# Patient Record
Sex: Female | Born: 1950
Health system: Southern US, Community
[De-identification: ages and names within clinical notes are randomized; demographics above are authoritative.]

## PROBLEM LIST (undated history)

## (undated) DIAGNOSIS — I2510 Atherosclerotic heart disease of native coronary artery without angina pectoris: Secondary | ICD-10-CM

## (undated) DIAGNOSIS — G4700 Insomnia, unspecified: Secondary | ICD-10-CM

## (undated) DIAGNOSIS — R3129 Other microscopic hematuria: Secondary | ICD-10-CM

## (undated) DIAGNOSIS — K219 Gastro-esophageal reflux disease without esophagitis: Secondary | ICD-10-CM

## (undated) DIAGNOSIS — K429 Umbilical hernia without obstruction or gangrene: Secondary | ICD-10-CM

## (undated) DIAGNOSIS — T8859XA Other complications of anesthesia, initial encounter: Secondary | ICD-10-CM

## (undated) DIAGNOSIS — J329 Chronic sinusitis, unspecified: Secondary | ICD-10-CM

## (undated) DIAGNOSIS — E871 Hypo-osmolality and hyponatremia: Secondary | ICD-10-CM

## (undated) DIAGNOSIS — G500 Trigeminal neuralgia: Secondary | ICD-10-CM

## (undated) DIAGNOSIS — R590 Localized enlarged lymph nodes: Secondary | ICD-10-CM

## (undated) DIAGNOSIS — M542 Cervicalgia: Secondary | ICD-10-CM

## (undated) DIAGNOSIS — N870 Mild cervical dysplasia: Secondary | ICD-10-CM

## (undated) DIAGNOSIS — M1990 Unspecified osteoarthritis, unspecified site: Secondary | ICD-10-CM

## (undated) DIAGNOSIS — R21 Rash and other nonspecific skin eruption: Secondary | ICD-10-CM

## (undated) DIAGNOSIS — M5126 Other intervertebral disc displacement, lumbar region: Secondary | ICD-10-CM

## (undated) DIAGNOSIS — I10 Essential (primary) hypertension: Secondary | ICD-10-CM

## (undated) DIAGNOSIS — E785 Hyperlipidemia, unspecified: Secondary | ICD-10-CM

## (undated) DIAGNOSIS — Z79899 Other long term (current) drug therapy: Secondary | ICD-10-CM

## (undated) DIAGNOSIS — F419 Anxiety disorder, unspecified: Secondary | ICD-10-CM

## (undated) DIAGNOSIS — D72819 Decreased white blood cell count, unspecified: Secondary | ICD-10-CM

## (undated) DIAGNOSIS — E7800 Pure hypercholesterolemia, unspecified: Secondary | ICD-10-CM

## (undated) DIAGNOSIS — Z8719 Personal history of other diseases of the digestive system: Secondary | ICD-10-CM

## (undated) DIAGNOSIS — M549 Dorsalgia, unspecified: Secondary | ICD-10-CM

## (undated) DIAGNOSIS — R519 Headache, unspecified: Secondary | ICD-10-CM

## (undated) DIAGNOSIS — L299 Pruritus, unspecified: Secondary | ICD-10-CM

## (undated) DIAGNOSIS — D649 Anemia, unspecified: Secondary | ICD-10-CM

## (undated) HISTORY — DX: Other long term (current) drug therapy: Z79.899

## (undated) HISTORY — DX: Umbilical hernia without obstruction or gangrene: K42.9

## (undated) HISTORY — PX: COLPOSCOPY: SHX161

## (undated) HISTORY — DX: Chronic sinusitis, unspecified: J32.9

## (undated) HISTORY — DX: Anxiety disorder, unspecified: F41.9

## (undated) HISTORY — DX: Pruritus, unspecified: L29.9

## (undated) HISTORY — DX: Hypo-osmolality and hyponatremia: E87.1

## (undated) HISTORY — DX: Hyperlipidemia, unspecified: E78.5

## (undated) HISTORY — DX: Dorsalgia, unspecified: M54.9

## (undated) HISTORY — DX: Trigeminal neuralgia: G50.0

## (undated) HISTORY — DX: Cervicalgia: M54.2

## (undated) HISTORY — PX: UMBILICAL HERNIA REPAIR: SHX196

## (undated) HISTORY — DX: Insomnia, unspecified: G47.00

## (undated) HISTORY — DX: Decreased white blood cell count, unspecified: D72.819

## (undated) HISTORY — DX: Other microscopic hematuria: R31.29

## (undated) HISTORY — PX: DILATION AND CURETTAGE OF UTERUS: SHX78

## (undated) HISTORY — PX: CERVICAL BIOPSY  W/ LOOP ELECTRODE EXCISION: SUR135

## (undated) HISTORY — DX: Gastro-esophageal reflux disease without esophagitis: K21.9

## (undated) HISTORY — PX: TYMPANOSTOMY TUBE PLACEMENT: SHX32

## (undated) HISTORY — DX: Essential (primary) hypertension: I10

## (undated) HISTORY — DX: Rash and other nonspecific skin eruption: R21

## (undated) HISTORY — DX: Headache, unspecified: R51.9

## (undated) HISTORY — DX: Pure hypercholesterolemia, unspecified: E78.00

## (undated) HISTORY — DX: Atherosclerotic heart disease of native coronary artery without angina pectoris: I25.10

## (undated) HISTORY — DX: Anemia, unspecified: D64.9

## (undated) HISTORY — DX: Other intervertebral disc displacement, lumbar region: M51.26

## (undated) HISTORY — DX: Mild cervical dysplasia: N87.0

## (undated) HISTORY — DX: Unspecified osteoarthritis, unspecified site: M19.90

## (undated) HISTORY — DX: Localized enlarged lymph nodes: R59.0

---

## 1997-12-15 ENCOUNTER — Other Ambulatory Visit: Admission: RE | Admit: 1997-12-15 | Discharge: 1997-12-15 | Payer: Self-pay | Admitting: Obstetrics and Gynecology

## 1998-06-23 ENCOUNTER — Other Ambulatory Visit: Admission: RE | Admit: 1998-06-23 | Discharge: 1998-06-23 | Payer: Self-pay | Admitting: Obstetrics and Gynecology

## 1999-01-03 ENCOUNTER — Ambulatory Visit (HOSPITAL_COMMUNITY): Admission: RE | Admit: 1999-01-03 | Discharge: 1999-01-03 | Payer: Self-pay | Admitting: Gastroenterology

## 1999-07-19 ENCOUNTER — Other Ambulatory Visit: Admission: RE | Admit: 1999-07-19 | Discharge: 1999-07-19 | Payer: Self-pay | Admitting: Obstetrics and Gynecology

## 2000-07-26 ENCOUNTER — Other Ambulatory Visit: Admission: RE | Admit: 2000-07-26 | Discharge: 2000-07-26 | Payer: Self-pay | Admitting: Obstetrics and Gynecology

## 2001-03-07 ENCOUNTER — Encounter (INDEPENDENT_AMBULATORY_CARE_PROVIDER_SITE_OTHER): Payer: Self-pay

## 2001-03-07 ENCOUNTER — Ambulatory Visit (HOSPITAL_COMMUNITY): Admission: RE | Admit: 2001-03-07 | Discharge: 2001-03-07 | Payer: Self-pay | Admitting: Obstetrics and Gynecology

## 2001-08-15 ENCOUNTER — Other Ambulatory Visit: Admission: RE | Admit: 2001-08-15 | Discharge: 2001-08-15 | Payer: Self-pay | Admitting: Obstetrics and Gynecology

## 2002-12-04 ENCOUNTER — Other Ambulatory Visit: Admission: RE | Admit: 2002-12-04 | Discharge: 2002-12-04 | Payer: Self-pay | Admitting: Obstetrics and Gynecology

## 2002-12-23 ENCOUNTER — Emergency Department (HOSPITAL_COMMUNITY): Admission: EM | Admit: 2002-12-23 | Discharge: 2002-12-23 | Payer: Self-pay | Admitting: Emergency Medicine

## 2003-04-14 ENCOUNTER — Other Ambulatory Visit: Admission: RE | Admit: 2003-04-14 | Discharge: 2003-04-14 | Payer: Self-pay | Admitting: Obstetrics and Gynecology

## 2003-09-28 ENCOUNTER — Other Ambulatory Visit: Admission: RE | Admit: 2003-09-28 | Discharge: 2003-09-28 | Payer: Self-pay | Admitting: Obstetrics and Gynecology

## 2003-12-21 ENCOUNTER — Encounter: Admission: RE | Admit: 2003-12-21 | Discharge: 2003-12-21 | Payer: Self-pay | Admitting: Obstetrics and Gynecology

## 2004-02-11 ENCOUNTER — Other Ambulatory Visit: Admission: RE | Admit: 2004-02-11 | Discharge: 2004-02-11 | Payer: Self-pay | Admitting: Obstetrics and Gynecology

## 2004-08-01 ENCOUNTER — Other Ambulatory Visit: Admission: RE | Admit: 2004-08-01 | Discharge: 2004-08-01 | Payer: Self-pay | Admitting: Addiction Medicine

## 2005-03-06 ENCOUNTER — Encounter: Admission: RE | Admit: 2005-03-06 | Discharge: 2005-03-06 | Payer: Self-pay | Admitting: Neurology

## 2005-03-29 ENCOUNTER — Encounter: Admission: RE | Admit: 2005-03-29 | Discharge: 2005-03-29 | Payer: Self-pay | Admitting: Obstetrics and Gynecology

## 2005-04-17 ENCOUNTER — Encounter: Admission: RE | Admit: 2005-04-17 | Discharge: 2005-04-17 | Payer: Self-pay | Admitting: Obstetrics and Gynecology

## 2005-04-26 ENCOUNTER — Other Ambulatory Visit: Admission: RE | Admit: 2005-04-26 | Discharge: 2005-04-26 | Payer: Self-pay | Admitting: Obstetrics and Gynecology

## 2005-12-06 ENCOUNTER — Other Ambulatory Visit: Admission: RE | Admit: 2005-12-06 | Discharge: 2005-12-06 | Payer: Self-pay | Admitting: Obstetrics and Gynecology

## 2006-04-13 ENCOUNTER — Encounter: Admission: RE | Admit: 2006-04-13 | Discharge: 2006-04-13 | Payer: Self-pay | Admitting: Obstetrics and Gynecology

## 2006-05-07 ENCOUNTER — Other Ambulatory Visit: Admission: RE | Admit: 2006-05-07 | Discharge: 2006-05-07 | Payer: Self-pay | Admitting: Obstetrics and Gynecology

## 2007-04-16 ENCOUNTER — Encounter: Admission: RE | Admit: 2007-04-16 | Discharge: 2007-04-16 | Payer: Self-pay | Admitting: Obstetrics and Gynecology

## 2007-05-09 ENCOUNTER — Other Ambulatory Visit: Admission: RE | Admit: 2007-05-09 | Discharge: 2007-05-09 | Payer: Self-pay | Admitting: Obstetrics and Gynecology

## 2008-05-20 ENCOUNTER — Encounter: Admission: RE | Admit: 2008-05-20 | Discharge: 2008-05-20 | Payer: Self-pay | Admitting: Obstetrics and Gynecology

## 2008-05-28 ENCOUNTER — Encounter: Payer: Self-pay | Admitting: Obstetrics and Gynecology

## 2008-05-28 ENCOUNTER — Other Ambulatory Visit: Admission: RE | Admit: 2008-05-28 | Discharge: 2008-05-28 | Payer: Self-pay | Admitting: Obstetrics and Gynecology

## 2008-05-28 ENCOUNTER — Ambulatory Visit: Payer: Self-pay | Admitting: Obstetrics and Gynecology

## 2008-06-22 ENCOUNTER — Encounter: Admission: RE | Admit: 2008-06-22 | Discharge: 2008-06-22 | Payer: Self-pay | Admitting: Internal Medicine

## 2009-03-16 ENCOUNTER — Encounter: Admission: RE | Admit: 2009-03-16 | Discharge: 2009-03-16 | Payer: Self-pay | Admitting: Otolaryngology

## 2009-05-24 ENCOUNTER — Encounter: Admission: RE | Admit: 2009-05-24 | Discharge: 2009-05-24 | Payer: Self-pay | Admitting: Obstetrics and Gynecology

## 2009-06-03 ENCOUNTER — Other Ambulatory Visit: Admission: RE | Admit: 2009-06-03 | Discharge: 2009-06-03 | Payer: Self-pay | Admitting: Obstetrics and Gynecology

## 2009-06-03 ENCOUNTER — Ambulatory Visit: Payer: Self-pay | Admitting: Obstetrics and Gynecology

## 2010-04-23 ENCOUNTER — Encounter: Payer: Self-pay | Admitting: Obstetrics and Gynecology

## 2010-05-11 ENCOUNTER — Other Ambulatory Visit: Payer: Self-pay | Admitting: Obstetrics and Gynecology

## 2010-05-11 DIAGNOSIS — Z1231 Encounter for screening mammogram for malignant neoplasm of breast: Secondary | ICD-10-CM

## 2010-05-26 ENCOUNTER — Ambulatory Visit
Admission: RE | Admit: 2010-05-26 | Discharge: 2010-05-26 | Disposition: A | Discharge status: Home / Self Care | Payer: 59 | Source: Ambulatory Visit | Attending: Obstetrics and Gynecology | Admitting: Obstetrics and Gynecology

## 2010-05-26 DIAGNOSIS — Z1231 Encounter for screening mammogram for malignant neoplasm of breast: Secondary | ICD-10-CM

## 2010-06-07 ENCOUNTER — Other Ambulatory Visit (HOSPITAL_COMMUNITY)
Admission: RE | Admit: 2010-06-07 | Discharge: 2010-06-07 | Disposition: A | Discharge status: Home / Self Care | Payer: 59 | Source: Ambulatory Visit | Attending: Obstetrics and Gynecology | Admitting: Obstetrics and Gynecology

## 2010-06-07 ENCOUNTER — Encounter (INDEPENDENT_AMBULATORY_CARE_PROVIDER_SITE_OTHER): Payer: 59 | Admitting: Obstetrics and Gynecology

## 2010-06-07 ENCOUNTER — Other Ambulatory Visit: Payer: Self-pay | Admitting: Obstetrics and Gynecology

## 2010-06-07 DIAGNOSIS — Z01419 Encounter for gynecological examination (general) (routine) without abnormal findings: Secondary | ICD-10-CM

## 2010-06-07 DIAGNOSIS — Z124 Encounter for screening for malignant neoplasm of cervix: Secondary | ICD-10-CM | POA: Insufficient documentation

## 2010-08-19 NOTE — Op Note (Signed)
[facility]  Patient:    Kimberly Baxter, PUN Visit Number: [id number] MRN: [id number]          Service Type: DSU Location: [facility] Attending Physician:  Sharon Mt Dictated by:   Rande Brunt. Eda Paschal, M.D. Proc. Date: 03/07/01 Admit Date:  03/07/2001                             Operative Report  PREOPERATIVE DIAGNOSIS:         Endometrial polyp.  POSTOPERATIVE DIAGNOSIS:        Endometrial polyp versus small mucous myoma.  OPERATION:                      Hysteroscopy with excision of endometrial lesion.  SURGEON:                        Daniel L. Eda Paschal, M.D.  ANESTHESIA:                     General.  INDICATIONS:                    The patient is a 60 year old female who had presented to the office with some symptoms of uterine prolapse.  Because of concern that there was swelling behind the uterus causing the uterus to have dropped an ultrasound was done, and at the time of the ultrasound an endometrial polyp was seen.  It was confirmed on sonohysterogram.  She now enters the hospital for excision of the above.  FINDINGS:                       External and vagina is within normal limits. The cervix is clean.  The uterus is severely retroverted, normal size and shape with 1.5 degrees of uterine descensus.  Adnexa are not palpably enlarged.  At the time of hysteroscopy, the patient had a lesion on the anterior wall of the uterus of approximately 1 to 1.5 cm that had a consistency somewhat between an endometrial polyp and a small myoma.  Other than this, the rest of the findings on hysteroscopy including top of the fundus, tubal, coronal areas, anterior and posterior walls of the fundus, lower uterine segment, endocervical canal were free of any disease.  DESCRIPTION OF PROCEDURE:       After adequate general anesthesia, the patient was placed in the dorsal lithotomy position, prepped and draped in the usual sterile manner.  A  single-tooth tenaculum was placed in the anterior lip of the cervix, and the cervix dilated to a #35 Pratt dilator.  A hysteroscopic examination was done with the hysteroscopic resectoscope and 3% sorbitol was used to expand the intrauterine cavity, and a camera was used for magnification.  The above findings were noted.  Using a single wire loop with settings of 70 coag, 110 cutting, blend I, the lesion was excised in several pieces.  The base was cauterized to prevent any bleeding.  At the termination of the procedure there was no bleeding noted.  The tissue was sent to pathology for tissue diagnosis.  There was a total fluid deficit of 400 cc, however, most of this was because there was a leak through the bag onto the floor and it was felt that the deficit was probably much less than 100.  The patient tolerated the procedure well and left the  operating room in satisfactory condition. Dictated by:   Rande Brunt. Eda Paschal, M.D. Attending Physician:  Sharon Mt DD:  03/07/01 TD:  03/07/01 Job: [id number]

## 2011-01-24 ENCOUNTER — Other Ambulatory Visit: Payer: Self-pay | Admitting: Dermatology

## 2011-04-04 DIAGNOSIS — M5126 Other intervertebral disc displacement, lumbar region: Secondary | ICD-10-CM

## 2011-04-04 HISTORY — DX: Other intervertebral disc displacement, lumbar region: M51.26

## 2011-05-02 ENCOUNTER — Other Ambulatory Visit: Payer: Self-pay | Admitting: Obstetrics and Gynecology

## 2011-05-02 DIAGNOSIS — Z1231 Encounter for screening mammogram for malignant neoplasm of breast: Secondary | ICD-10-CM

## 2011-05-29 ENCOUNTER — Ambulatory Visit
Admission: RE | Admit: 2011-05-29 | Discharge: 2011-05-29 | Disposition: A | Discharge status: Home / Self Care | Payer: 59 | Source: Ambulatory Visit | Attending: Obstetrics and Gynecology | Admitting: Obstetrics and Gynecology

## 2011-05-29 DIAGNOSIS — Z1231 Encounter for screening mammogram for malignant neoplasm of breast: Secondary | ICD-10-CM

## 2011-06-07 ENCOUNTER — Encounter: Payer: Self-pay | Admitting: Gynecology

## 2011-06-07 DIAGNOSIS — E7800 Pure hypercholesterolemia, unspecified: Secondary | ICD-10-CM | POA: Insufficient documentation

## 2011-06-07 DIAGNOSIS — K429 Umbilical hernia without obstruction or gangrene: Secondary | ICD-10-CM | POA: Insufficient documentation

## 2011-06-07 DIAGNOSIS — G500 Trigeminal neuralgia: Secondary | ICD-10-CM | POA: Insufficient documentation

## 2011-06-07 DIAGNOSIS — N870 Mild cervical dysplasia: Secondary | ICD-10-CM | POA: Insufficient documentation

## 2011-06-15 ENCOUNTER — Other Ambulatory Visit (HOSPITAL_COMMUNITY)
Admission: RE | Admit: 2011-06-15 | Discharge: 2011-06-15 | Disposition: A | Discharge status: Home / Self Care | Payer: 59 | Source: Ambulatory Visit | Attending: Obstetrics and Gynecology | Admitting: Obstetrics and Gynecology

## 2011-06-15 ENCOUNTER — Encounter: Payer: Self-pay | Admitting: Obstetrics and Gynecology

## 2011-06-15 ENCOUNTER — Ambulatory Visit (INDEPENDENT_AMBULATORY_CARE_PROVIDER_SITE_OTHER): Payer: 59 | Admitting: Obstetrics and Gynecology

## 2011-06-15 VITALS — BP 122/74 | Ht 66.0 in | Wt 142.0 lb

## 2011-06-15 DIAGNOSIS — Z01419 Encounter for gynecological examination (general) (routine) without abnormal findings: Secondary | ICD-10-CM | POA: Insufficient documentation

## 2011-06-15 DIAGNOSIS — M1990 Unspecified osteoarthritis, unspecified site: Secondary | ICD-10-CM | POA: Insufficient documentation

## 2011-06-15 NOTE — Progress Notes (Signed)
The patient came to see me today for her annual GYN exam. She is doing well without GYN problems. She does not require HRT. She does use vaginal estrogen with excellent results. She is having no vaginal bleeding. She is having no pelvic pain. She does her lab through PCP. She is up-to-date on mammograms. She had normal bone density.  Physical examination: Kimberly Baxter present. HEENT within normal limits. Neck: Thyroid not large. No masses. Supraclavicular nodes: not enlarged. Breasts: Examined in both sitting midline position. No skin changes and no masses. Abdomen: Soft no guarding rebound or masses or hernia. Pelvic: External: Within normal limits. BUS: Within normal limits. Vaginal:within normal limits. Good estrogen effect. No evidence of cystocele rectocele or enterocele. Cervix: clean. Uterus: Normal size and shape. Adnexa: No masses. Rectovaginal exam: Confirmatory and negative. Extremities: Within normal limits.  Assessment: Atrophic vaginitis  Plan: Continue yearly mammograms. Continue estradiol vaginal cream 0.02% twice a week.

## 2011-06-16 LAB — URINALYSIS W MICROSCOPIC + REFLEX CULTURE
Bilirubin Urine: NEGATIVE
Crystals: NONE SEEN
Hgb urine dipstick: NEGATIVE
Nitrite: NEGATIVE
Protein, ur: NEGATIVE mg/dL
Specific Gravity, Urine: 1.006 (ref 1.005–1.030)
pH: 7 (ref 5.0–8.0)

## 2011-07-11 ENCOUNTER — Other Ambulatory Visit: Payer: Self-pay | Admitting: Dermatology

## 2011-11-22 ENCOUNTER — Ambulatory Visit
Admission: RE | Admit: 2011-11-22 | Discharge: 2011-11-22 | Disposition: A | Discharge status: Home / Self Care | Payer: 59 | Source: Ambulatory Visit | Attending: Internal Medicine | Admitting: Internal Medicine

## 2011-11-22 ENCOUNTER — Other Ambulatory Visit: Payer: Self-pay | Admitting: Internal Medicine

## 2011-11-22 DIAGNOSIS — M79605 Pain in left leg: Secondary | ICD-10-CM

## 2011-11-22 DIAGNOSIS — M545 Low back pain: Secondary | ICD-10-CM

## 2011-11-22 DIAGNOSIS — M79604 Pain in right leg: Secondary | ICD-10-CM

## 2012-05-13 ENCOUNTER — Other Ambulatory Visit: Payer: Self-pay | Admitting: *Deleted

## 2012-05-13 DIAGNOSIS — Z1231 Encounter for screening mammogram for malignant neoplasm of breast: Secondary | ICD-10-CM

## 2012-06-11 ENCOUNTER — Ambulatory Visit
Admission: RE | Admit: 2012-06-11 | Discharge: 2012-06-11 | Disposition: A | Discharge status: Home / Self Care | Payer: 59 | Source: Ambulatory Visit | Attending: *Deleted | Admitting: *Deleted

## 2012-06-11 DIAGNOSIS — Z1231 Encounter for screening mammogram for malignant neoplasm of breast: Secondary | ICD-10-CM

## 2012-08-21 ENCOUNTER — Encounter: Payer: Self-pay | Admitting: Obstetrics and Gynecology

## 2012-08-21 ENCOUNTER — Ambulatory Visit (INDEPENDENT_AMBULATORY_CARE_PROVIDER_SITE_OTHER): Payer: 59 | Admitting: Obstetrics and Gynecology

## 2012-08-21 VITALS — BP 100/60 | Ht 66.0 in | Wt 140.0 lb

## 2012-08-21 DIAGNOSIS — Z01419 Encounter for gynecological examination (general) (routine) without abnormal findings: Secondary | ICD-10-CM

## 2012-08-21 MED ORDER — ESTRADIOL 0.1 MG/GM VA CREA: TOPICAL_CREAM | VAGINAL | Status: DC

## 2012-08-21 NOTE — Patient Instructions (Signed)

## 2012-08-21 NOTE — Progress Notes (Addendum)
62 y.o.   Married    Caucasian   female   G2P2002   here for annual exam.  Took HRT for about 1 1/2 years and quit b/c of strong FH of cancer.  H/o 2 LEEP's back in the 90's, nl paps since.  Uses her e2 vag cream infrequently, but last time she had sex, it was painful.    Patient's last menstrual period was 04/03/2002.          Sexually active: yes  The current method of family planning is post menopausal status.    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 Colon cancer Father   . Pancreatic cancer Father   . Stomach cancer Father   . Diabetes Maternal Grandmother   . Heart disease Maternal Grandmother   . Hypertension Maternal Grandfather   . Heart disease Maternal Grandfather   . Cancer Mother     Carcinoid syndrome    Patient Active Problem List   Diagnosis Date Noted  . Arthritis   . CIN I (cervical intraepithelial neoplasia I)   . Elevated cholesterol   . Trigeminal neuralgia   . Umbilical hernia     Past Medical History  Diagnosis Date  . CIN I (cervical intraepithelial neoplasia I)   . Elevated cholesterol   . Trigeminal neuralgia   . Umbilical hernia   . Arthritis   . Hypertension   . Lumbar herniated disc 2013    Past Surgical History  Procedure Laterality Date  . Colposcopy    . Cervical biopsy  w/ loop electrode excision  [id number]    X 2  . Umbilical hernia repair      Allergies: Review of patient's allergies indicates no known allergies.  Current Outpatient Prescriptions  Medication Sig Dispense Refill  . CarBAMazepine (TEGRETOL  PO) Take 200 mg by mouth 2 (two) times daily.       Marland Kitchen ESTRADIOL VA Place vaginally 2 (two) times a week.       . losartan (COZAAR) 50 MG tablet Take 50 mg by mouth daily.      . Rosuvastatin Calcium (CRESTOR PO) Take 20 mg by mouth at bedtime.       . Calcium Carbonate-Vitamin D (CALCIUM + D PO) Take by mouth.       No current facility-administered medications for this visit.  Tegretol is for trigeminal neuralgia on right.    ROS: Pertinent items are noted in HPI.  Social Hx: married, two grown children, a one year old grandchild   Exam:    BP 100/60  Ht 5\' 6"  (1.676 m)  Wt 140 lb (63.504 kg)  BMI 22.61 kg/m2  LMP 04/03/2002   Wt Readings from Last 3 Encounters:  08/21/12 140 lb (63.504 kg)  06/15/11 142 lb (64.411 kg)     Ht Readings from Last 3 Encounters:  08/21/12 5\' 6"  (1.676 m)  06/15/11 5\' 6"  (1.676 m)    General appearance: alert, cooperative and appears stated age Head: Normocephalic, without obvious abnormality, atraumatic  Neck: no adenopathy, supple, symmetrical, trachea midline and thyroid not enlarged, symmetric, no tenderness/mass/nodules Lungs: clear to auscultation bilaterally Breasts: Inspection negative, No nipple retraction or dimpling, No nipple discharge or bleeding, No axillary or supraclavicular adenopathy, Normal to palpation without dominant masses Heart: regular rate and rhythm Abdomen: soft, non-tender; bowel sounds normal; no masses,  no organomegaly Extremities: extremities normal, atraumatic, no cyanosis or edema Skin: Skin color, texture, turgor normal. No rashes or lesions Lymph nodes: Cervical, supraclavicular, and axillary nodes normal. No abnormal inguinal nodes palpated Neurologic: Grossly normal   Pelvic: External genitalia:  no lesions              Urethra:  normal appearing urethra with no masses, tenderness or lesions              Bartholins and Skenes: normal                 Vagina: normal appearing vagina with normal color and  discharge, no lesions              Cervix: normal appearance              Pap taken: no        Bimanual Exam:  Uterus:  uterus is normal size, shape, consistency and nontender, AF, mobile                                      Adnexa: normal adnexa in size, nontender and no masses                                      Rectovaginal: Confirms                                      Anus:  normal sphincter tone, no lesions  A: normal menopausal exam, no HRT     Trigeminal neuralgia, under tx with tegretol     Wears a hearing aid left ear     H/o LGSIL recurrently from 2004 until 2006.  S/p LEEP x 2, last one in 2006 showing atypia only.  Paps since then nl.       P: mammogram counseled on breast self exam, mammography screening, adequate intake of calcium and vitamin D, diet and exercise return annually or prn     An After Visit Summary was printed and given to the patient.

## 2013-01-14 ENCOUNTER — Encounter: Payer: Self-pay | Admitting: Gynecology

## 2013-03-18 ENCOUNTER — Encounter: Payer: Self-pay | Admitting: Interventional Cardiology

## 2013-03-18 ENCOUNTER — Encounter (INDEPENDENT_AMBULATORY_CARE_PROVIDER_SITE_OTHER): Payer: Self-pay

## 2013-03-18 ENCOUNTER — Encounter: Payer: Self-pay | Admitting: Cardiology

## 2013-03-18 ENCOUNTER — Ambulatory Visit (INDEPENDENT_AMBULATORY_CARE_PROVIDER_SITE_OTHER): Payer: 59 | Admitting: Interventional Cardiology

## 2013-03-18 VITALS — BP 124/88 | HR 74 | Ht 66.0 in | Wt 137.0 lb

## 2013-03-18 DIAGNOSIS — R0600 Dyspnea, unspecified: Secondary | ICD-10-CM

## 2013-03-18 DIAGNOSIS — R0989 Other specified symptoms and signs involving the circulatory and respiratory systems: Secondary | ICD-10-CM

## 2013-03-18 DIAGNOSIS — R0609 Other forms of dyspnea: Secondary | ICD-10-CM

## 2013-03-18 DIAGNOSIS — E7800 Pure hypercholesterolemia, unspecified: Secondary | ICD-10-CM

## 2013-03-18 NOTE — Progress Notes (Signed)
Patient ID: Kimberly Baxter, female   DOB: 06-24-1950, 62 y.o.   MRN: [id number]     Patient ID: Kimberly Baxter MRN: [id number] DOB/AGE: 04-18-1950 62 y.o.   Referring Physician Dr. Dossie Arbour   Reason for Consultation [facility]  HPI: 62 y/o who had an episode of severe SHOB while walking out a stadium in October.  She was in [place].  She had flown there.  Her daughter was well ahead of her.  No sweating, nausea, arm pain, palpitations.  Her husband, who never exercises and was overweight, was having no problems.  Since that time, she has not had any severe SHOB.  She walks up stairs without any difficulties.  She is limited by back problems as well.    She does not have a strong family h/o CAD.  No severe cardiac disease in the family in her generation.  A granfather had heartdisease, but no CABG.   Hyperlipidemia controlled by Crestor.    She has tolerated exercise recently.  She had a chest xray.     Current Outpatient Prescriptions  Medication Sig Dispense Refill  . Calcium Carbonate-Vitamin D (CALCIUM + D PO) Take by mouth.      . CarBAMazepine (TEGRETOL PO) Take 200 mg by mouth 2 (two) times daily.       Marland Kitchen estradiol (ESTRACE) 0.1 MG/GM vaginal cream 1/2 g pv qhs x 2 wks, then 1/2 g pv 2-3 times q wk  42.5 g  6  . Rosuvastatin Calcium (CRESTOR PO) Take 20 mg by mouth at bedtime.        No current facility-administered medications for this visit.   Past Medical History  Diagnosis Date  . CIN I (cervical intraepithelial neoplasia I)   . Elevated cholesterol   . Trigeminal neuralgia   . Umbilical hernia   . Arthritis   . Hypertension   . Lumbar herniated disc 2013    Family History  Problem Relation Age of Onset  . Colon cancer Father   . Pancreatic cancer Father   . Stomach cancer Father   . Diabetes Maternal Grandmother   . Heart disease Maternal Grandmother   . Hypertension Maternal Grandfather   . Heart disease Maternal Grandfather   . Cancer Mother     Carcinoid  syndrome    History   Social History  . Marital Status: Married    Spouse Name: N/A    Number of Children: N/A  . Years of Education: N/A   Occupational History  . Not on file.   Social History Main Topics  . Smoking status: Never Smoker   . Smokeless tobacco: Not on file  . Alcohol Use: 1.5 oz/week    3 drink(s) per week     Comment: socially alcohol 2-3 a week  . Drug Use: No  . Sexual Activity: Yes    Partners: Male    Birth Control/ Protection: Post-menopausal   Other Topics Concern  . Not on file   Social History Narrative  . No narrative on file    Past Surgical History  Procedure Laterality Date  . Colposcopy    . Cervical biopsy  w/ loop electrode excision  [id number]    X 2  . Umbilical hernia repair        (Not in a hospital admission)  Review of systems complete and found to be negative unless listed above .  No nausea, vomiting.  No fever chills, No focal weakness,  No palpitations.  Physical Exam: Filed Vitals:  03/18/13 1611  BP: 124/88  Pulse: 74    Weight: 137 lb (62.143 kg)  Physical exam:  Schulter/AT EOMI No JVD, No carotid bruit RRR S1S2  No wheezing Soft. NT, nondistended No edema. No focal motor or sensory deficits Normal affect  Labs:   No results found for this basename: WBC, HGB, HCT, MCV, PLT   No results found for this basename: NA, K, CL, CO2, BUN, CREATININE, CALCIUM, LABALBU, PROT, BILITOT, ALKPHOS, ALT, AST, GLUCOSE,  in the last 168 hours No results found for this basename: CKTOTAL, CKMB, CKMBINDEX, TROPONINI    No results found for this basename: CHOL   No results found for this basename: HDL   No results found for this basename: LDLCALC   No results found for this basename: TRIG   No results found for this basename: CHOLHDL   No results found for this basename: LDLDIRECT      Radiology: Chest x-ray showed no active disease EKG: Normal sinus rhythm, no ST segment changes  ASSESSMENT AND PLAN:  DOE: Unclear  etiology of her episode of shortness of breath.  She was not on a long car trip to [place]. It was a plane ride. No swelling in her legs. No severe shortness of breath since then. I doubt DVT/PE. Will check exercise treadmill test. We'll check echocardiogram to evaluate for structural heart disease.   Hyperlipidemia:  Continue rosuvastatin for lipid lowering therapy.   Signed:   Fredric Mare, MD, [place] 03/18/2013, 4:25 PM

## 2013-03-18 NOTE — Patient Instructions (Signed)
Your physician has requested that you have an exercise tolerance test. For further information please visit [email or web address]. Please also follow instruction sheet, as given.  Your physician has requested that you have an echocardiogram. Echocardiography is a painless test that uses sound waves to create images of your heart. It provides your doctor with information about the size and shape of your heart and how well your heart's chambers and valves are working. This procedure takes approximately one hour. There are no restrictions for this procedure.   

## 2013-04-08 ENCOUNTER — Ambulatory Visit (HOSPITAL_COMMUNITY)
Admission: RE | Admit: 2013-04-08 | Discharge: 2013-04-08 | Disposition: A | Discharge status: Home / Self Care | Payer: 59 | Source: Ambulatory Visit | Attending: Interventional Cardiology | Admitting: Interventional Cardiology

## 2013-04-08 DIAGNOSIS — R0609 Other forms of dyspnea: Secondary | ICD-10-CM | POA: Insufficient documentation

## 2013-04-08 DIAGNOSIS — R0989 Other specified symptoms and signs involving the circulatory and respiratory systems: Principal | ICD-10-CM | POA: Insufficient documentation

## 2013-04-08 DIAGNOSIS — R0600 Dyspnea, unspecified: Secondary | ICD-10-CM

## 2013-04-09 ENCOUNTER — Ambulatory Visit (HOSPITAL_COMMUNITY): Payer: 59 | Attending: Interventional Cardiology | Admitting: Radiology

## 2013-04-09 DIAGNOSIS — E785 Hyperlipidemia, unspecified: Secondary | ICD-10-CM | POA: Insufficient documentation

## 2013-04-09 DIAGNOSIS — R0600 Dyspnea, unspecified: Secondary | ICD-10-CM

## 2013-04-09 DIAGNOSIS — R0602 Shortness of breath: Secondary | ICD-10-CM | POA: Insufficient documentation

## 2013-04-09 DIAGNOSIS — I10 Essential (primary) hypertension: Secondary | ICD-10-CM | POA: Insufficient documentation

## 2013-04-09 NOTE — Progress Notes (Signed)
Echocardiogram performed.  

## 2013-04-10 ENCOUNTER — Telehealth: Payer: Self-pay | Admitting: Interventional Cardiology

## 2013-04-10 NOTE — Telephone Encounter (Signed)
Follow Up ° °Pt returned call for results. Please call  °

## 2013-04-10 NOTE — Telephone Encounter (Signed)
rtc pts call to give her results.

## 2013-06-10 ENCOUNTER — Other Ambulatory Visit: Payer: Self-pay

## 2013-06-10 DIAGNOSIS — Z1231 Encounter for screening mammogram for malignant neoplasm of breast: Secondary | ICD-10-CM

## 2013-07-01 ENCOUNTER — Ambulatory Visit: Payer: 59

## 2013-07-08 ENCOUNTER — Ambulatory Visit
Admission: RE | Admit: 2013-07-08 | Discharge: 2013-07-08 | Disposition: A | Discharge status: Home / Self Care | Payer: 59 | Source: Ambulatory Visit

## 2013-07-08 DIAGNOSIS — Z1231 Encounter for screening mammogram for malignant neoplasm of breast: Secondary | ICD-10-CM

## 2013-08-26 ENCOUNTER — Ambulatory Visit: Payer: 59 | Admitting: Obstetrics and Gynecology

## 2013-08-28 ENCOUNTER — Ambulatory Visit: Payer: 59 | Admitting: Gynecology

## 2013-08-29 ENCOUNTER — Encounter: Payer: Self-pay | Admitting: Gynecology

## 2013-08-29 ENCOUNTER — Ambulatory Visit (INDEPENDENT_AMBULATORY_CARE_PROVIDER_SITE_OTHER): Payer: 59 | Admitting: Gynecology

## 2013-08-29 VITALS — BP 120/80 | HR 68 | Ht 65.75 in | Wt 140.0 lb

## 2013-08-29 DIAGNOSIS — Z124 Encounter for screening for malignant neoplasm of cervix: Secondary | ICD-10-CM

## 2013-08-29 DIAGNOSIS — Z0000 Encounter for general adult medical examination without abnormal findings: Secondary | ICD-10-CM

## 2013-08-29 DIAGNOSIS — Z01419 Encounter for gynecological examination (general) (routine) without abnormal findings: Secondary | ICD-10-CM

## 2013-08-29 DIAGNOSIS — Z13820 Encounter for screening for osteoporosis: Secondary | ICD-10-CM

## 2013-08-29 LAB — POCT URINALYSIS DIPSTICK
BILIRUBIN UA: NEGATIVE
Blood, UA: NEGATIVE
GLUCOSE UA: NEGATIVE
Ketones, UA: NEGATIVE
NITRITE UA: NEGATIVE
Protein, UA: NEGATIVE
UROBILINOGEN UA: NEGATIVE
pH, UA: 6

## 2013-08-29 NOTE — Progress Notes (Signed)
Patient ID: Kimberly Baxter, female   DOB: 11-Sep-1950, 63 y.o.   MRN: [id number] 62 y.o. Married Caucasian female   G2P2002 here for annual exam. Pt reports menses absent due to menopause.  She does not report hot flashes, does not have night sweats, occasionally will have vaginal dryness.  She is using lubricants, gel.  She does not report post-menopasual bleeding.  Not very sexually active, intermittent with vaginal estrogen  Patient's last menstrual period was 04/03/2002.          Sexually active: yes  The current method of family planning is post menopausal status.    Exercising: no  The patient does not participate in regular exercise at present. Last pap:  06/15/11, WNL Abnormal PAP:  1997, 2006 (LEEP) Mammogram:  07/08/13, Bi-Rads 1:  Negative  BSE:  sporadically Colonoscopy:  2014, repeat in 5 years DEXA:  05/27/07 normal  Alcohol:  2 drinks per week Tobacco:  None   Labs:  HB:  PCP  Urine:  Trace leuks  Health Maintenance  Topic Date Due  . Colonoscopy  11/27/1998  . Zostavax  11/26/2008  . Pap Smear  06/14/2012  . Influenza Vaccine  11/01/2013  . Mammogram  07/09/2015  . Tetanus/tdap  06/06/2020    Family History  Problem Relation Age of Onset  . Colon cancer Father   . Pancreatic cancer Father   . Stomach cancer Father   . Diabetes Maternal Grandmother   . Heart disease Maternal Grandmother   . Hypertension Maternal Grandfather   . Heart disease Maternal Grandfather   . Cancer Mother     Carcinoid syndrome    Patient Active Problem List   Diagnosis Date Noted  . Arthritis   . CIN I (cervical intraepithelial neoplasia I)   . Elevated cholesterol   . Trigeminal neuralgia   . Umbilical hernia     Past Medical History  Diagnosis Date  . CIN I (cervical intraepithelial neoplasia I)   . Elevated cholesterol   . Trigeminal neuralgia   . Umbilical hernia   . Arthritis   . Hypertension   . Lumbar herniated disc 2013    Past Surgical History  Procedure Laterality  Date  . Colposcopy    . Cervical biopsy  w/ loop electrode excision  [id number]    X 2  . Umbilical hernia repair      Allergies: Review of patient's allergies indicates no known allergies.  Current Outpatient Prescriptions  Medication Sig Dispense Refill  . Calcium Carbonate-Vitamin D (CALCIUM + D PO) Take by mouth.      . CarBAMazepine (TEGRETOL PO) Take 200 mg by mouth 2 (two) times daily.       Marland Kitchen estradiol (ESTRACE) 0.1 MG/GM vaginal cream 1/2 g pv qhs x 2 wks, then 1/2 g pv 2-3 times q wk  42.5 g  6  . Rosuvastatin Calcium (CRESTOR PO) Take 20 mg by mouth at bedtime.        No current facility-administered medications for this visit.    ROS: Pertinent items are noted in HPI.  Exam:    BP 120/80  Pulse 68  Ht 5' 5.75" (1.67 m)  Wt 140 lb (63.504 kg)  BMI 22.77 kg/m2  LMP 04/03/2002 Weight change: @WEIGHTCHANGE @ Last 3 height recordings:  Ht Readings from Last 3 Encounters:  08/29/13 5' 5.75" (1.67 m)  03/18/13 5\' 6"  (1.676 m)  08/21/12 5\' 6"  (1.676 m)   General appearance: alert, cooperative and appears stated age Head: Normocephalic, without obvious  abnormality, atraumatic Neck: no adenopathy, no carotid bruit, no JVD, supple, symmetrical, trachea midline and thyroid not enlarged, symmetric, no tenderness/mass/nodules Lungs: clear to auscultation bilaterally Breasts: normal appearance, no masses or tenderness Heart: regular rate and rhythm, S1, S2 normal, no murmur, click, rub or gallop Abdomen: soft, non-tender; bowel sounds normal; no masses,  no organomegaly Extremities: extremities normal, atraumatic, no cyanosis or edema Skin: Skin color, texture, turgor normal. No rashes or lesions Lymph nodes: Cervical, supraclavicular, and axillary nodes normal. no inguinal nodes palpated Neurologic: Grossly normal   Pelvic: External genitalia:  no lesions              Urethra: normal appearing urethra with no masses, tenderness or lesions              Bartholins and  Skenes: Bartholin's, Urethra, Skene's normal                 Vagina: atrophic              Cervix: normal appearance              Pap taken: yes        Bimanual Exam:  Uterus:  uterus is normal size, shape, consistency and nontender                                      Adnexa:    no masses                                      Rectovaginal: Confirms                                      Anus:  normal sphincter tone, no lesions  A: well woman H/o LEEP     P: mammogram pap smear with HRHPV DEXA Vit D counseled on breast self exam, mammography screening, osteoporosis, adequate intake of calcium and vitamin D, diet and exercise return annually or prn Discussed PAP guideline changes, importance of weight bearing exercises, calcium, vit D and balanced diet.  An After Visit Summary was printed and given to the patient.

## 2013-08-29 NOTE — Patient Instructions (Signed)
  EVOO or cocoanut oil for lubrication

## 2013-08-30 LAB — VITAMIN D 25 HYDROXY (VIT D DEFICIENCY, FRACTURES): VIT D 25 HYDROXY: 39 ng/mL (ref 30–89)

## 2013-09-02 ENCOUNTER — Ambulatory Visit (INDEPENDENT_AMBULATORY_CARE_PROVIDER_SITE_OTHER): Payer: 59 | Admitting: Certified Nurse Midwife

## 2013-09-02 ENCOUNTER — Encounter: Payer: Self-pay | Admitting: Certified Nurse Midwife

## 2013-09-02 VITALS — BP 104/62 | HR 64 | Temp 97.6°F | Resp 16 | Ht 65.75 in | Wt 138.0 lb

## 2013-09-02 DIAGNOSIS — N390 Urinary tract infection, site not specified: Secondary | ICD-10-CM

## 2013-09-02 LAB — POCT URINALYSIS DIPSTICK
Bilirubin, UA: NEGATIVE
Glucose, UA: NEGATIVE
Ketones, UA: NEGATIVE
Nitrite, UA: NEGATIVE
Urobilinogen, UA: NEGATIVE
pH, UA: 7

## 2013-09-02 LAB — IPS PAP TEST WITH HPV

## 2013-09-02 MED ORDER — NITROFURANTOIN MONOHYD MACRO 100 MG PO CAPS: 100.0000 mg | ORAL_CAPSULE | Freq: Two times a day (BID) | ORAL | Status: DC

## 2013-09-02 NOTE — Patient Instructions (Signed)
Urinary Tract Infection  Urinary tract infections (UTIs) can develop anywhere along your urinary tract. Your urinary tract is your body's drainage system for removing wastes and extra water. Your urinary tract includes two kidneys, two ureters, a bladder, and a urethra. Your kidneys are a pair of bean-shaped organs. Each kidney is about the size of your fist. They are located below your ribs, one on each side of your spine.  CAUSES  Infections are caused by microbes, which are microscopic organisms, including fungi, viruses, and bacteria. These organisms are so small that they can only be seen through a microscope. Bacteria are the microbes that most commonly cause UTIs.  SYMPTOMS   Symptoms of UTIs may vary by age and gender of the patient and by the location of the infection. Symptoms in young women typically include a frequent and intense urge to urinate and a painful, burning feeling in the bladder or urethra during urination. Older women and men are more likely to be tired, shaky, and weak and have muscle aches and abdominal pain. A fever may mean the infection is in your kidneys. Other symptoms of a kidney infection include pain in your back or sides below the ribs, nausea, and vomiting.  DIAGNOSIS  To diagnose a UTI, your caregiver will ask you about your symptoms. Your caregiver also will ask to provide a urine sample. The urine sample will be tested for bacteria and white blood cells. White blood cells are made by your body to help fight infection.  TREATMENT   Typically, UTIs can be treated with medication. Because most UTIs are caused by a bacterial infection, they usually can be treated with the use of antibiotics. The choice of antibiotic and length of treatment depend on your symptoms and the type of bacteria causing your infection.  HOME CARE INSTRUCTIONS   If you were prescribed antibiotics, take them exactly as your caregiver instructs you. Finish the medication even if you feel better after you  have only taken some of the medication.   Drink enough water and fluids to keep your urine clear or pale yellow.   Avoid caffeine, tea, and carbonated beverages. They tend to irritate your bladder.   Empty your bladder often. Avoid holding urine for long periods of time.   Empty your bladder before and after sexual intercourse.   After a bowel movement, women should cleanse from front to back. Use each tissue only once.  SEEK MEDICAL CARE IF:    You have back pain.   You develop a fever.   Your symptoms do not begin to resolve within 3 days.  SEEK IMMEDIATE MEDICAL CARE IF:    You have severe back pain or lower abdominal pain.   You develop chills.   You have nausea or vomiting.   You have continued burning or discomfort with urination.  MAKE SURE YOU:    Understand these instructions.   Will watch your condition.   Will get help right away if you are not doing well or get worse.  Document Released: 12/28/2004 Document Revised: 09/19/2011 Document Reviewed: 04/28/2011  [employer] Patient Information 2014 [employer].

## 2013-09-02 NOTE — Progress Notes (Signed)
63 y.o. married white female g2p2 here with complaint of UTI, with onset  on 3-4 days. Patient complaining of urinary frequency/urgency/ and pain with urination. Patient denies fever, chills, nausea or back pain. No new personal products. Patient feels not related to sexual activity. Denies any vaginal symptoms or new personal products. . Menopausal with vaginal dryness, and uses Estrace prn. Use moisturizer for sexual activity. Used bubble bath prior to onset. No other issues today.   O: Healthy female WDWN Affect: Normal, orientation x 3 Skin : warm and dry CVAT: negatvie bilateral Abdomen: positive for suprapubic tenderness  Pelvic exam: External genital area: normal, no lesions Bladder,Urethra, Urethral meatus: tender Vagina: normal vaginal discharge, normal appearance  Wet prep not taken Cervix: normal, non tender Uterus:normal,non tender Adnexa: normal non tender, no fullness or masses   A: UTI Vaginal dryness uses Estrace cream prn  P: Reviewed findings of UTI [id number] see order [id number] micro, culture Reviewed warning signs and symptoms of UTI Encouraged to limit soda, tea, and coffee RV 2 week if TOC needed for positive culture Encouraged to use Estrace more regularly, if concerned about use can use Olive oil or coconut oil daily for dryness and sexual activity.  RV prn

## 2013-09-03 LAB — URINALYSIS, MICROSCOPIC ONLY
Bacteria, UA: NONE SEEN
CASTS: NONE SEEN
Crystals: NONE SEEN
SQUAMOUS EPITHELIAL / LPF: NONE SEEN

## 2013-09-05 LAB — URINE CULTURE: Colony Count: >=100000

## 2013-09-05 NOTE — Progress Notes (Signed)
Reviewed personally.  M. Suzanne Aztlan Coll, MD.  

## 2013-09-05 NOTE — Addendum Note (Signed)
Addended by: Regina Eck on: 09/05/2013 11:15 AM   Modules accepted: Orders

## 2013-09-05 NOTE — Progress Notes (Signed)
Reviewed personally.  M. Suzanne Deneice Wack, MD.  

## 2013-09-16 ENCOUNTER — Ambulatory Visit (INDEPENDENT_AMBULATORY_CARE_PROVIDER_SITE_OTHER): Payer: 59 | Admitting: *Deleted

## 2013-09-16 VITALS — BP 126/68 | HR 72 | Resp 18 | Ht 65.75 in | Wt 139.0 lb

## 2013-09-16 DIAGNOSIS — N390 Urinary tract infection, site not specified: Secondary | ICD-10-CM

## 2013-09-16 NOTE — Progress Notes (Signed)
Patient in today for TOC. Patient denies any Sx and stated she finished Abx. Please call results to her cell phone.

## 2013-09-17 LAB — URINE CULTURE
Colony Count: NO GROWTH
ORGANISM ID, BACTERIA: NO GROWTH

## 2013-09-23 ENCOUNTER — Other Ambulatory Visit: Payer: Self-pay | Admitting: *Deleted

## 2013-09-23 DIAGNOSIS — E2839 Other primary ovarian failure: Secondary | ICD-10-CM

## 2013-09-24 ENCOUNTER — Telehealth: Payer: Self-pay | Admitting: *Deleted

## 2013-09-24 NOTE — Telephone Encounter (Signed)
[facility]ed the [facility] to schedule Bone Density which I scheduled for 10/21/13 @ 10:00  Called patient to notify her of this got her voicemail LM of Bone Density Appointment for 10/21/13 @ 10:00 am and left # of [facility].  Patient then called me back and I notified her of BMD appointment.

## 2013-10-21 ENCOUNTER — Ambulatory Visit
Admission: RE | Admit: 2013-10-21 | Discharge: 2013-10-21 | Disposition: A | Discharge status: Home / Self Care | Payer: 59 | Source: Ambulatory Visit | Attending: Gynecology | Admitting: Gynecology

## 2013-10-21 DIAGNOSIS — E2839 Other primary ovarian failure: Secondary | ICD-10-CM

## 2013-12-15 DIAGNOSIS — E782 Mixed hyperlipidemia: Secondary | ICD-10-CM | POA: Diagnosis not present

## 2013-12-15 DIAGNOSIS — R82998 Other abnormal findings in urine: Secondary | ICD-10-CM | POA: Diagnosis not present

## 2013-12-15 DIAGNOSIS — R809 Proteinuria, unspecified: Secondary | ICD-10-CM | POA: Diagnosis not present

## 2013-12-15 DIAGNOSIS — I10 Essential (primary) hypertension: Secondary | ICD-10-CM | POA: Diagnosis not present

## 2013-12-24 DIAGNOSIS — Z0000 Encounter for general adult medical examination without abnormal findings: Secondary | ICD-10-CM | POA: Diagnosis not present

## 2013-12-24 DIAGNOSIS — IMO0002 Reserved for concepts with insufficient information to code with codable children: Secondary | ICD-10-CM | POA: Diagnosis not present

## 2013-12-24 DIAGNOSIS — E782 Mixed hyperlipidemia: Secondary | ICD-10-CM | POA: Diagnosis not present

## 2013-12-24 DIAGNOSIS — M549 Dorsalgia, unspecified: Secondary | ICD-10-CM | POA: Diagnosis not present

## 2013-12-24 DIAGNOSIS — Z23 Encounter for immunization: Secondary | ICD-10-CM | POA: Diagnosis not present

## 2013-12-24 DIAGNOSIS — G500 Trigeminal neuralgia: Secondary | ICD-10-CM | POA: Diagnosis not present

## 2013-12-24 DIAGNOSIS — Z79899 Other long term (current) drug therapy: Secondary | ICD-10-CM | POA: Diagnosis not present

## 2013-12-24 DIAGNOSIS — D72819 Decreased white blood cell count, unspecified: Secondary | ICD-10-CM | POA: Diagnosis not present

## 2013-12-24 DIAGNOSIS — R030 Elevated blood-pressure reading, without diagnosis of hypertension: Secondary | ICD-10-CM | POA: Diagnosis not present

## 2014-01-08 DIAGNOSIS — H524 Presbyopia: Secondary | ICD-10-CM | POA: Diagnosis not present

## 2014-01-08 DIAGNOSIS — H2513 Age-related nuclear cataract, bilateral: Secondary | ICD-10-CM | POA: Diagnosis not present

## 2014-01-08 DIAGNOSIS — H5203 Hypermetropia, bilateral: Secondary | ICD-10-CM | POA: Diagnosis not present

## 2014-01-16 ENCOUNTER — Other Ambulatory Visit: Payer: Self-pay

## 2014-02-02 ENCOUNTER — Encounter: Payer: Self-pay | Admitting: Certified Nurse Midwife

## 2014-03-12 DIAGNOSIS — H9222 Otorrhagia, left ear: Secondary | ICD-10-CM | POA: Diagnosis not present

## 2014-03-12 DIAGNOSIS — H7202 Central perforation of tympanic membrane, left ear: Secondary | ICD-10-CM | POA: Diagnosis not present

## 2014-03-17 DIAGNOSIS — L710 Perioral dermatitis: Secondary | ICD-10-CM | POA: Diagnosis not present

## 2014-04-03 HISTORY — PX: TYMPANOSTOMY TUBE PLACEMENT: SHX32

## 2014-05-27 DIAGNOSIS — Z85828 Personal history of other malignant neoplasm of skin: Secondary | ICD-10-CM | POA: Diagnosis not present

## 2014-05-27 DIAGNOSIS — L821 Other seborrheic keratosis: Secondary | ICD-10-CM | POA: Diagnosis not present

## 2014-05-27 DIAGNOSIS — D225 Melanocytic nevi of trunk: Secondary | ICD-10-CM | POA: Diagnosis not present

## 2014-05-27 DIAGNOSIS — L570 Actinic keratosis: Secondary | ICD-10-CM | POA: Diagnosis not present

## 2014-06-22 DIAGNOSIS — R312 Other microscopic hematuria: Secondary | ICD-10-CM | POA: Diagnosis not present

## 2014-07-08 ENCOUNTER — Other Ambulatory Visit: Payer: Self-pay

## 2014-07-08 DIAGNOSIS — Z1231 Encounter for screening mammogram for malignant neoplasm of breast: Secondary | ICD-10-CM

## 2014-07-20 ENCOUNTER — Ambulatory Visit
Admission: RE | Admit: 2014-07-20 | Discharge: 2014-07-20 | Disposition: A | Discharge status: Home / Self Care | Payer: Medicare Other | Source: Ambulatory Visit

## 2014-07-20 DIAGNOSIS — Z1231 Encounter for screening mammogram for malignant neoplasm of breast: Secondary | ICD-10-CM | POA: Diagnosis not present

## 2014-09-02 ENCOUNTER — Ambulatory Visit: Payer: 59 | Admitting: Gynecology

## 2014-09-03 DIAGNOSIS — S99922A Unspecified injury of left foot, initial encounter: Secondary | ICD-10-CM | POA: Diagnosis not present

## 2014-09-03 DIAGNOSIS — Z6822 Body mass index (BMI) 22.0-22.9, adult: Secondary | ICD-10-CM | POA: Diagnosis not present

## 2014-09-03 DIAGNOSIS — M2012 Hallux valgus (acquired), left foot: Secondary | ICD-10-CM | POA: Diagnosis not present

## 2014-09-28 ENCOUNTER — Other Ambulatory Visit: Payer: Self-pay

## 2014-10-09 ENCOUNTER — Ambulatory Visit: Payer: Self-pay | Admitting: Obstetrics and Gynecology

## 2014-10-14 ENCOUNTER — Encounter: Payer: Self-pay | Admitting: Obstetrics and Gynecology

## 2014-10-14 ENCOUNTER — Ambulatory Visit (INDEPENDENT_AMBULATORY_CARE_PROVIDER_SITE_OTHER): Payer: Medicare Other | Admitting: Obstetrics and Gynecology

## 2014-10-14 VITALS — BP 120/66 | HR 68 | Resp 16 | Ht 65.5 in | Wt 136.0 lb

## 2014-10-14 DIAGNOSIS — Z124 Encounter for screening for malignant neoplasm of cervix: Secondary | ICD-10-CM

## 2014-10-14 DIAGNOSIS — Z01419 Encounter for gynecological examination (general) (routine) without abnormal findings: Secondary | ICD-10-CM

## 2014-10-14 NOTE — Progress Notes (Signed)
64 y.o. G71P2002 Married Caucasian female here for annual exam.    Not on HRT  Not very sexually active.  Not having any vaginal bleeding.   Herniated disc of L5, S1.  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  Husband has a English as a second language teacher.  Patient has worked with him.  Daughter just married last weekend.  Patient enjoys a condo at [employer].   PCP: Bevelyn Buckles     Patient's last menstrual period was 04/03/2002.          Sexually active: Yes.    The current method of family planning is post menopausal status.    Exercising: Yes.    Walking Smoker:  no  Health Maintenance: Pap:  08/29/13 Neg. HR HPV:neg History of abnormal Pap:  Yes, Hx of Leep x 2. 1997 and 2006.   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 S/p LEEP x 2, last one in 2006 showing atypia only. Paps since then nl. MMG:  07/21/14 BIRADS1:Neg Colonoscopy:  2014 repeat 5 years  BMD:   10/2013   Osteopenia - T score - 1.1. TDaP:  PCP Screening Labs:  Hb today: PCP, Urine today: PCP   reports that she has never smoked. She has never used smokeless tobacco. She reports that she drinks about 1.5 oz of alcohol per week. She reports that she does not use illicit drugs.  Past Medical History  Diagnosis Date  . CIN I (cervical intraepithelial neoplasia I)   . Elevated cholesterol   . Trigeminal neuralgia   . Umbilical hernia   . Arthritis   . Hypertension   . Lumbar herniated disc 2013    Past Surgical History  Procedure Laterality Date  . Colposcopy    . Cervical biopsy  w/ loop electrode excision  [id number]    X 2  . Umbilical hernia repair      Current Outpatient Prescriptions  Medication Sig Dispense Refill  . carbamazepine (TEGRETOL) 200 MG tablet Take 1.5 tablets by mouth 2 (two) times daily.    . CRESTOR 20 MG tablet Take 1 tablet by mouth daily.    . Magnesium 100 MG CAPS Take by mouth daily.    . Multiple Vitamin (MULTIVITAMIN) tablet Take 1 tablet by mouth daily.     No current  facility-administered medications for this visit.    Family History  Problem Relation Age of Onset  . Colon cancer Father   . Pancreatic cancer Father   . Stomach cancer Father   . Diabetes Maternal Grandmother   . Heart disease Maternal Grandmother   . Hypertension Maternal Grandfather   . Heart disease Maternal Grandfather   . Cancer Mother     Carcinoid syndrome    ROS:  Pertinent items are noted in HPI.  Otherwise, a comprehensive ROS was negative.  Exam:   BP 120/66 mmHg  Pulse 68  Resp 16  Ht 5' 5.5" (1.664 m)  Wt 136 lb (61.689 kg)  BMI 22.28 kg/m2  LMP 04/03/2002    General appearance: alert, cooperative and appears stated age Head: Normocephalic, without obvious abnormality, atraumatic Neck: no adenopathy, supple, symmetrical, trachea midline and thyroid normal to inspection and palpation Lungs: clear to auscultation bilaterally Breasts: normal appearance, no masses or tenderness, Inspection negative, No nipple retraction or dimpling, No nipple discharge or bleeding, No axillary or supraclavicular adenopathy Heart: regular rate and rhythm Abdomen: soft, non-tender; bowel sounds normal; no masses,  no organomegaly Extremities: extremities normal, atraumatic, no cyanosis or edema Skin: Skin color,  texture, turgor normal. No rashes or lesions Lymph nodes: Cervical, supraclavicular, and axillary nodes normal. No abnormal inguinal nodes palpated Neurologic: Grossly normal  Pelvic: External genitalia:  no lesions              Urethra:  normal appearing urethra with no masses, tenderness or lesions              Bartholins and Skenes: normal                 Vagina: normal appearing vagina with normal color and discharge, no lesions              Cervix: no lesions.  Appearance consistent with LEEP.              Pap taken: Yes.   Bimanual Exam:  Uterus:  normal size, contour, position, consistency, mobility, non-tender              Adnexa: normal adnexa and no mass,  fullness, tenderness              Rectovaginal: Yes.  .  Confirms.              Anus:  normal sphincter tone, no lesions  Chaperone was present for exam.  Assessment:   Well woman visit with normal exam. Hx of LEEP x 2. LGSIL and atypia.  Mild osteopenia.   Plan: Yearly mammogram recommended after age 56.  Recommended self breast exam.  Pap and HR HPV as above. Discussed Calcium, Vitamin D, regular exercise program including cardiovascular and weight bearing exercise. Bone density in 2 - 3 years. Labs performed.  No..     Refills given on medications.  No..    Follow up annually and prn.     After visit summary provided.

## 2014-10-14 NOTE — Patient Instructions (Signed)

## 2014-10-15 LAB — IPS PAP SMEAR ONLY

## 2014-12-23 DIAGNOSIS — E782 Mixed hyperlipidemia: Secondary | ICD-10-CM | POA: Diagnosis not present

## 2014-12-23 DIAGNOSIS — I10 Essential (primary) hypertension: Secondary | ICD-10-CM | POA: Diagnosis not present

## 2014-12-30 DIAGNOSIS — G500 Trigeminal neuralgia: Secondary | ICD-10-CM | POA: Diagnosis not present

## 2014-12-30 DIAGNOSIS — Z6822 Body mass index (BMI) 22.0-22.9, adult: Secondary | ICD-10-CM | POA: Diagnosis not present

## 2014-12-30 DIAGNOSIS — Z1389 Encounter for screening for other disorder: Secondary | ICD-10-CM | POA: Diagnosis not present

## 2014-12-30 DIAGNOSIS — Z23 Encounter for immunization: Secondary | ICD-10-CM | POA: Diagnosis not present

## 2014-12-30 DIAGNOSIS — I10 Essential (primary) hypertension: Secondary | ICD-10-CM | POA: Diagnosis not present

## 2014-12-30 DIAGNOSIS — M5489 Other dorsalgia: Secondary | ICD-10-CM | POA: Diagnosis not present

## 2014-12-30 DIAGNOSIS — Z0000 Encounter for general adult medical examination without abnormal findings: Secondary | ICD-10-CM | POA: Diagnosis not present

## 2014-12-30 DIAGNOSIS — K219 Gastro-esophageal reflux disease without esophagitis: Secondary | ICD-10-CM | POA: Diagnosis not present

## 2014-12-30 DIAGNOSIS — E782 Mixed hyperlipidemia: Secondary | ICD-10-CM | POA: Diagnosis not present

## 2015-02-01 DIAGNOSIS — H903 Sensorineural hearing loss, bilateral: Secondary | ICD-10-CM | POA: Diagnosis not present

## 2015-03-30 DIAGNOSIS — H10413 Chronic giant papillary conjunctivitis, bilateral: Secondary | ICD-10-CM | POA: Diagnosis not present

## 2015-05-27 DIAGNOSIS — Z23 Encounter for immunization: Secondary | ICD-10-CM | POA: Diagnosis not present

## 2015-05-27 DIAGNOSIS — L820 Inflamed seborrheic keratosis: Secondary | ICD-10-CM | POA: Diagnosis not present

## 2015-06-15 ENCOUNTER — Other Ambulatory Visit: Payer: Self-pay

## 2015-06-15 DIAGNOSIS — Z1231 Encounter for screening mammogram for malignant neoplasm of breast: Secondary | ICD-10-CM

## 2015-07-27 ENCOUNTER — Ambulatory Visit
Admission: RE | Admit: 2015-07-27 | Discharge: 2015-07-27 | Disposition: A | Discharge status: Home / Self Care | Payer: Medicare Other | Source: Ambulatory Visit

## 2015-07-27 DIAGNOSIS — Z1231 Encounter for screening mammogram for malignant neoplasm of breast: Secondary | ICD-10-CM

## 2015-08-23 DIAGNOSIS — Z6822 Body mass index (BMI) 22.0-22.9, adult: Secondary | ICD-10-CM | POA: Diagnosis not present

## 2015-08-23 DIAGNOSIS — J209 Acute bronchitis, unspecified: Secondary | ICD-10-CM | POA: Diagnosis not present

## 2015-09-08 DIAGNOSIS — Z85828 Personal history of other malignant neoplasm of skin: Secondary | ICD-10-CM | POA: Diagnosis not present

## 2015-09-08 DIAGNOSIS — L821 Other seborrheic keratosis: Secondary | ICD-10-CM | POA: Diagnosis not present

## 2015-09-08 DIAGNOSIS — D225 Melanocytic nevi of trunk: Secondary | ICD-10-CM | POA: Diagnosis not present

## 2015-09-08 DIAGNOSIS — Z872 Personal history of diseases of the skin and subcutaneous tissue: Secondary | ICD-10-CM | POA: Diagnosis not present

## 2015-09-28 DIAGNOSIS — R05 Cough: Secondary | ICD-10-CM | POA: Diagnosis not present

## 2015-09-28 DIAGNOSIS — J329 Chronic sinusitis, unspecified: Secondary | ICD-10-CM | POA: Diagnosis not present

## 2015-10-20 ENCOUNTER — Ambulatory Visit: Payer: Medicare Other | Admitting: Obstetrics and Gynecology

## 2015-10-26 DIAGNOSIS — J343 Hypertrophy of nasal turbinates: Secondary | ICD-10-CM | POA: Diagnosis not present

## 2015-10-26 DIAGNOSIS — G501 Atypical facial pain: Secondary | ICD-10-CM | POA: Diagnosis not present

## 2015-10-26 DIAGNOSIS — J342 Deviated nasal septum: Secondary | ICD-10-CM | POA: Diagnosis not present

## 2015-10-26 DIAGNOSIS — R51 Headache: Secondary | ICD-10-CM | POA: Diagnosis not present

## 2015-11-04 NOTE — Progress Notes (Signed)
65 y.o. G36P2002 Married Caucasian female here for annual exam.    No health concerns.   Husband's parents are aging and with medical issues. Expecting three grandchildren.   Retired NP.  PCP:   Leanna Battles, MD  Patient's last menstrual period was 04/03/2002 (approximate).           Sexually active: Yes.   --only occasionally The current method of family planning is post menopausal status.    Exercising: Yes.    Cardio and weights--not lately due to taking care of in-laws Smoker:  no  Health Maintenance: Pap:  10-14-14 Neg History of abnormal Pap:  Yes, Hx of Leep x 2. 1997 and 2006.   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 S/p LEEP x 2, last one in 2006 showing atypia only. Paps since then nl. MMG:  07-27-15 Density B/Neg/[facility] Colonoscopy:  2014 normal with [facility];next due 2019 BMD:   10/2013  Result  Osteopenia -- T score - 1.1 - spine and left hip. TDaP:  PCP Gardasil:   N/A HIV:  Thinks she was tested years ago in 2000. Hep C:  PCP.  Screening Labs:  Hb today: PCP, Urine today: unable to void   reports that she has never smoked. She has never used smokeless tobacco. She reports that she drinks about 3.6 oz of alcohol per week . She reports that she does not use drugs.  Past Medical History:  Diagnosis Date  . Arthritis   . CIN I (cervical intraepithelial neoplasia I)   . Elevated cholesterol   . Hypertension   . Lumbar herniated disc 2013  . Trigeminal neuralgia   . Umbilical hernia     Past Surgical History:  Procedure Laterality Date  . CERVICAL BIOPSY  W/ LOOP ELECTRODE EXCISION  [id number]   X 2  . COLPOSCOPY    . UMBILICAL HERNIA REPAIR      Current Outpatient Prescriptions  Medication Sig Dispense Refill  . carbamazepine (TEGRETOL) 200 MG tablet Take 1.5 tablets by mouth 2 (two) times daily.    . CRESTOR 20 MG tablet Take 1 tablet by mouth daily.    . fluticasone (FLONASE) 50 MCG/ACT nasal spray Place 1 spray into both  nostrils as needed.    . Magnesium 100 MG CAPS Take by mouth daily.    . Multiple Vitamin (MULTIVITAMIN) tablet Take 1 tablet by mouth daily.     No current facility-administered medications for this visit.     Family History  Problem Relation Age of Onset  . Colon cancer Father   . Pancreatic cancer Father   . Stomach cancer Father   . Diabetes Maternal Grandmother   . Heart disease Maternal Grandmother   . Hypertension Maternal Grandfather   . Heart disease Maternal Grandfather   . Cancer Mother     Carcinoid syndrome    ROS:  Pertinent items are noted in HPI.  Otherwise, a comprehensive ROS was negative.  Exam:   BP 110/76 (BP Location: Right Arm, Patient Position: Sitting, Cuff Size: Normal)   Pulse 70   Ht 5\' 6"  (1.676 m)   Wt 139 lb 12.8 oz (63.4 kg)   LMP 04/03/2002 (Approximate)   BMI 22.56 kg/m     General appearance: alert, cooperative and appears stated age Head: Normocephalic, without obvious abnormality, atraumatic Neck: no adenopathy, supple, symmetrical, trachea midline and thyroid normal to inspection and palpation Lungs: clear to auscultation bilaterally Breasts: normal appearance, no masses or tenderness, No nipple retraction or  dimpling, No nipple discharge or bleeding, No axillary or supraclavicular adenopathy Heart: regular rate and rhythm Abdomen: soft, non-tender; no masses, no organomegaly Extremities: extremities normal, atraumatic, no cyanosis or edema Skin: Skin color, texture, turgor normal. No rashes or lesions Lymph nodes: Cervical, supraclavicular, and axillary nodes normal. No abnormal inguinal nodes palpated Neurologic: Grossly normal  Pelvic: External genitalia:  no lesions              Urethra:  normal appearing urethra with no masses, tenderness or lesions              Bartholins and Skenes: normal                 Vagina: normal appearing vagina with normal color and discharge, no lesions              Cervix: no lesions.  Consistent  with LEEP.              Pap taken: No. Bimanual Exam:  Uterus:  normal size, contour, position, consistency, mobility, non-tender              Adnexa: no mass, fullness, tenderness              Rectal exam: Yes.  .  Confirms.              Anus:  normal sphincter tone, no lesions  Chaperone was present for exam.  Assessment:   Well woman visit with normal exam. Hx of LEEP x 2. LGSIL and atypia.  Mild osteopenia. Menopausal female.  FH of gastric/colon CA father and carcinoid in mother.   Plan: Yearly mammogram recommended after age 41.  Recommended self breast exam.  Pap and HR HPV as above. Discussed Calcium, Vitamin D, regular exercise program including cardiovascular and weight bearing exercise. Bone density next year.  Follow up annually and prn.       After visit summary provided.

## 2015-11-05 ENCOUNTER — Encounter: Payer: Self-pay | Admitting: Obstetrics and Gynecology

## 2015-11-05 ENCOUNTER — Ambulatory Visit (INDEPENDENT_AMBULATORY_CARE_PROVIDER_SITE_OTHER): Payer: Medicare Other | Admitting: Obstetrics and Gynecology

## 2015-11-05 VITALS — BP 110/76 | HR 70 | Ht 66.0 in | Wt 139.8 lb

## 2015-11-05 DIAGNOSIS — Z124 Encounter for screening for malignant neoplasm of cervix: Secondary | ICD-10-CM

## 2015-11-05 DIAGNOSIS — Z01419 Encounter for gynecological examination (general) (routine) without abnormal findings: Secondary | ICD-10-CM | POA: Diagnosis not present

## 2015-11-05 DIAGNOSIS — M8580 Other specified disorders of bone density and structure, unspecified site: Secondary | ICD-10-CM | POA: Diagnosis not present

## 2015-11-05 DIAGNOSIS — Z780 Asymptomatic menopausal state: Secondary | ICD-10-CM

## 2015-11-05 NOTE — Patient Instructions (Signed)

## 2015-12-28 DIAGNOSIS — E782 Mixed hyperlipidemia: Secondary | ICD-10-CM | POA: Diagnosis not present

## 2015-12-28 DIAGNOSIS — R8299 Other abnormal findings in urine: Secondary | ICD-10-CM | POA: Diagnosis not present

## 2015-12-28 DIAGNOSIS — I10 Essential (primary) hypertension: Secondary | ICD-10-CM | POA: Diagnosis not present

## 2015-12-28 DIAGNOSIS — N390 Urinary tract infection, site not specified: Secondary | ICD-10-CM | POA: Diagnosis not present

## 2016-01-04 DIAGNOSIS — Z0000 Encounter for general adult medical examination without abnormal findings: Secondary | ICD-10-CM | POA: Diagnosis not present

## 2016-01-04 DIAGNOSIS — E871 Hypo-osmolality and hyponatremia: Secondary | ICD-10-CM | POA: Diagnosis not present

## 2016-01-04 DIAGNOSIS — Z6822 Body mass index (BMI) 22.0-22.9, adult: Secondary | ICD-10-CM | POA: Diagnosis not present

## 2016-01-04 DIAGNOSIS — M5489 Other dorsalgia: Secondary | ICD-10-CM | POA: Diagnosis not present

## 2016-01-04 DIAGNOSIS — E782 Mixed hyperlipidemia: Secondary | ICD-10-CM | POA: Diagnosis not present

## 2016-01-04 DIAGNOSIS — Z1389 Encounter for screening for other disorder: Secondary | ICD-10-CM | POA: Diagnosis not present

## 2016-01-04 DIAGNOSIS — R05 Cough: Secondary | ICD-10-CM | POA: Diagnosis not present

## 2016-01-04 DIAGNOSIS — I10 Essential (primary) hypertension: Secondary | ICD-10-CM | POA: Diagnosis not present

## 2016-01-04 DIAGNOSIS — G500 Trigeminal neuralgia: Secondary | ICD-10-CM | POA: Diagnosis not present

## 2016-01-04 DIAGNOSIS — Z23 Encounter for immunization: Secondary | ICD-10-CM | POA: Diagnosis not present

## 2016-01-04 DIAGNOSIS — K429 Umbilical hernia without obstruction or gangrene: Secondary | ICD-10-CM | POA: Diagnosis not present

## 2016-01-17 DIAGNOSIS — H524 Presbyopia: Secondary | ICD-10-CM | POA: Diagnosis not present

## 2016-01-17 DIAGNOSIS — H2513 Age-related nuclear cataract, bilateral: Secondary | ICD-10-CM | POA: Diagnosis not present

## 2016-01-26 DIAGNOSIS — K439 Ventral hernia without obstruction or gangrene: Secondary | ICD-10-CM | POA: Diagnosis not present

## 2016-08-29 ENCOUNTER — Other Ambulatory Visit: Payer: Self-pay | Admitting: Internal Medicine

## 2016-08-29 DIAGNOSIS — Z1231 Encounter for screening mammogram for malignant neoplasm of breast: Secondary | ICD-10-CM

## 2016-09-11 ENCOUNTER — Ambulatory Visit
Admission: RE | Admit: 2016-09-11 | Discharge: 2016-09-11 | Disposition: A | Discharge status: Home / Self Care | Payer: Medicare Other | Source: Ambulatory Visit | Attending: Internal Medicine | Admitting: Internal Medicine

## 2016-09-11 DIAGNOSIS — Z1231 Encounter for screening mammogram for malignant neoplasm of breast: Secondary | ICD-10-CM

## 2016-10-26 DIAGNOSIS — H6612 Chronic tubotympanic suppurative otitis media, left ear: Secondary | ICD-10-CM | POA: Diagnosis not present

## 2016-10-26 DIAGNOSIS — H7292 Unspecified perforation of tympanic membrane, left ear: Secondary | ICD-10-CM | POA: Diagnosis not present

## 2016-11-14 DIAGNOSIS — H6612 Chronic tubotympanic suppurative otitis media, left ear: Secondary | ICD-10-CM | POA: Diagnosis not present

## 2016-11-14 DIAGNOSIS — H9122 Sudden idiopathic hearing loss, left ear: Secondary | ICD-10-CM | POA: Diagnosis not present

## 2016-11-14 DIAGNOSIS — H9113 Presbycusis, bilateral: Secondary | ICD-10-CM | POA: Diagnosis not present

## 2016-11-16 ENCOUNTER — Ambulatory Visit (INDEPENDENT_AMBULATORY_CARE_PROVIDER_SITE_OTHER): Payer: Medicare Other | Admitting: Obstetrics and Gynecology

## 2016-11-16 ENCOUNTER — Encounter: Payer: Self-pay | Admitting: Obstetrics and Gynecology

## 2016-11-16 ENCOUNTER — Other Ambulatory Visit (HOSPITAL_COMMUNITY)
Admission: RE | Admit: 2016-11-16 | Discharge: 2016-11-16 | Disposition: A | Discharge status: Home / Self Care | Payer: Medicare Other | Source: Ambulatory Visit | Attending: Obstetrics and Gynecology | Admitting: Obstetrics and Gynecology

## 2016-11-16 VITALS — BP 132/84 | HR 70 | Resp 16 | Ht 65.5 in | Wt 141.6 lb

## 2016-11-16 DIAGNOSIS — Z124 Encounter for screening for malignant neoplasm of cervix: Secondary | ICD-10-CM | POA: Diagnosis not present

## 2016-11-16 DIAGNOSIS — Z780 Asymptomatic menopausal state: Secondary | ICD-10-CM | POA: Diagnosis not present

## 2016-11-16 DIAGNOSIS — Z01419 Encounter for gynecological examination (general) (routine) without abnormal findings: Secondary | ICD-10-CM | POA: Diagnosis not present

## 2016-11-16 NOTE — Patient Instructions (Signed)

## 2016-11-16 NOTE — Addendum Note (Signed)
Addended by: Yisroel Ramming, Dietrich Pates E on: 11/16/2016 02:31 PM   Modules accepted: Orders

## 2016-11-16 NOTE — Progress Notes (Signed)
66 y.o. G45P2002 Married Caucasian female here for annual exam.    New grandchildren. Twins born and one has subglottic stenosis and tracheal malasia.  Not exercising as much.   Having left otitis media.  Losing hearing in that ear.  PCP:  Bevelyn Buckles, MD   Patient's last menstrual period was 04/03/2002 (approximate).           Sexually active: Yes.   female--only rarely The current method of family planning is post menopausal status.    Exercising: No.   Smoker:  no  Health Maintenance: Pap: 10-14-14 Neg; 08-29-13 Neg:Neg HR HPV History of abnormal Pap:  Yes, Hx of Leep x 2. 1997 and 2006. 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 S/p LEEP x 2, last one in 2006 showing atypia only. Paps since then nl. MMG: 09-11-16 Density B/neg/BiRads1:[facility] Colonoscopy:  2014 normal with [facility];next due 2019 BMD: 10/2013  Result:Osteopenia -- T score - 1.1 - spine and left hip. TDaP:  PCP Gardasil:   no HIV: Thinks she was tested years ago in 2000--Neg Hep C: with PCP Screening Labs:  Hb today: PCP, Urine today: not done   reports that she has never smoked. She has never used smokeless tobacco. She reports that she drinks about 3.6 oz of alcohol per week . She reports that she does not use drugs.  Past Medical History:  Diagnosis Date  . Arthritis   . CIN I (cervical intraepithelial neoplasia I)   . Elevated cholesterol   . Hypertension   . Lumbar herniated disc 2013  . Trigeminal neuralgia   . Umbilical hernia     Past Surgical History:  Procedure Laterality Date  . CERVICAL BIOPSY  W/ LOOP ELECTRODE EXCISION  [id number]   X 2  . COLPOSCOPY    . UMBILICAL HERNIA REPAIR      Current Outpatient Prescriptions  Medication Sig Dispense Refill  . carbamazepine (TEGRETOL) 200 MG tablet Take 1.5 tablets by mouth 2 (two) times daily.    . CRESTOR 20 MG tablet Take 1 tablet by mouth daily.    . fluticasone (FLONASE) 50 MCG/ACT nasal spray Place 1 spray into both nostrils as  needed.    . Magnesium 100 MG CAPS Take by mouth daily.    . Multiple Vitamin (MULTIVITAMIN) tablet Take 1 tablet by mouth daily.     No current facility-administered medications for this visit.     Family History  Problem Relation Age of Onset  . Colon cancer Father   . Pancreatic cancer Father   . Stomach cancer Father   . Diabetes Maternal Grandmother   . Heart disease Maternal Grandmother   . Hypertension Maternal Grandfather   . Heart disease Maternal Grandfather   . Cancer Mother        Carcinoid syndrome    ROS:  Pertinent items are noted in HPI.  Otherwise, a comprehensive ROS was negative.  Exam:   BP 132/84 (BP Location: Right Arm, Patient Position: Sitting, Cuff Size: Small)   Pulse 70   Resp 16   Ht 5' 5.5" (1.664 m)   Wt 141 lb 9.6 oz (64.2 kg)   LMP 04/03/2002 (Approximate)   BMI 23.21 kg/m     General appearance: alert, cooperative and appears stated age Head: Normocephalic, without obvious abnormality, atraumatic Neck: no adenopathy, supple, symmetrical, trachea midline and thyroid normal to inspection and palpation Lungs: clear to auscultation bilaterally Breasts: normal appearance, no masses or tenderness, No nipple retraction or dimpling, No nipple discharge  or bleeding, No axillary or supraclavicular adenopathy Heart: regular rate and rhythm Abdomen: soft, non-tender; no masses, no organomegaly Extremities: extremities normal, atraumatic, no cyanosis or edema Skin: Skin color, texture, turgor normal. No rashes or lesions Lymph nodes: Cervical, supraclavicular, and axillary nodes normal. No abnormal inguinal nodes palpated Neurologic: Grossly normal  Pelvic: External genitalia:  no lesions              Urethra:  normal appearing urethra with no masses, tenderness or lesions              Bartholins and Skenes: normal                 Vagina: normal appearing vagina with normal color and discharge, no lesions              Cervix: no lesions               Pap taken: Yes.   Bimanual Exam:  Uterus:  normal size, contour, position, consistency, mobility, non-tender              Adnexa: no mass, fullness, tenderness              Rectal exam: No..  Confirms.              Anus:  normal sphincter tone, no lesions  Chaperone was present for exam.  Assessment:   Well woman visit with normal exam. Hx LEEP x 2.  LGSIL and atypia.  Mild osteopenia.  FH gastric/colon CA father and carcinoid in mother.   Plan: Mammogram screening discussed. Recommended self breast awareness. Pap and HR HPV as above. Guidelines for Calcium, Vitamin D, regular exercise program including cardiovascular and weight bearing exercise. [facility]  She will call to schedule this.  Labs with PCP.  Follow up annually and prn.   After visit summary provided.

## 2016-11-21 LAB — CYTOLOGY - PAP: DIAGNOSIS: NEGATIVE

## 2016-11-29 ENCOUNTER — Other Ambulatory Visit: Payer: Self-pay | Admitting: Obstetrics and Gynecology

## 2016-11-29 DIAGNOSIS — M8580 Other specified disorders of bone density and structure, unspecified site: Secondary | ICD-10-CM

## 2016-12-06 ENCOUNTER — Ambulatory Visit: Payer: Medicare Other | Admitting: Obstetrics and Gynecology

## 2016-12-07 DIAGNOSIS — H9042 Sensorineural hearing loss, unilateral, left ear, with unrestricted hearing on the contralateral side: Secondary | ICD-10-CM | POA: Diagnosis not present

## 2016-12-07 DIAGNOSIS — Z7289 Other problems related to lifestyle: Secondary | ICD-10-CM | POA: Diagnosis not present

## 2016-12-07 DIAGNOSIS — H663X2 Other chronic suppurative otitis media, left ear: Secondary | ICD-10-CM | POA: Diagnosis not present

## 2016-12-07 DIAGNOSIS — H7202 Central perforation of tympanic membrane, left ear: Secondary | ICD-10-CM | POA: Diagnosis not present

## 2016-12-08 ENCOUNTER — Other Ambulatory Visit: Payer: Medicare Other

## 2016-12-12 ENCOUNTER — Other Ambulatory Visit: Payer: Medicare Other

## 2016-12-12 DIAGNOSIS — Z23 Encounter for immunization: Secondary | ICD-10-CM | POA: Diagnosis not present

## 2016-12-20 ENCOUNTER — Ambulatory Visit
Admission: RE | Admit: 2016-12-20 | Discharge: 2016-12-20 | Disposition: A | Discharge status: Home / Self Care | Payer: Medicare Other | Source: Ambulatory Visit | Attending: Obstetrics and Gynecology | Admitting: Obstetrics and Gynecology

## 2016-12-20 DIAGNOSIS — M8580 Other specified disorders of bone density and structure, unspecified site: Secondary | ICD-10-CM

## 2016-12-20 DIAGNOSIS — Z780 Asymptomatic menopausal state: Secondary | ICD-10-CM | POA: Diagnosis not present

## 2016-12-20 DIAGNOSIS — M8589 Other specified disorders of bone density and structure, multiple sites: Secondary | ICD-10-CM | POA: Diagnosis not present

## 2016-12-27 DIAGNOSIS — I10 Essential (primary) hypertension: Secondary | ICD-10-CM | POA: Diagnosis not present

## 2016-12-27 DIAGNOSIS — E782 Mixed hyperlipidemia: Secondary | ICD-10-CM | POA: Diagnosis not present

## 2016-12-28 ENCOUNTER — Telehealth: Payer: Self-pay

## 2016-12-28 NOTE — Telephone Encounter (Signed)
Called patient to discuss results of BMD, left message on voicemail to call me back(no current DPR).

## 2016-12-28 NOTE — Telephone Encounter (Signed)
-----   Message from Nunzio Cobbs, MD sent at 12/22/2016 12:49 PM EDT ----- Results to patient through My Chart.  Hello Ms. Kimberly Baxter,   Your bone density report shows some minor changes in the spine and hip called osteopenia.  This is not osteoporosis.  Your fracture risk is considered to be low. No prescription medication is indicated.  I recommend weight bearing exercise and 3 - 4 servings of dairy products per day. We will repeat your bone density test in 2 years to recheck this.  Please contact the office for any questions.   Have a good weekend!  [name], MD

## 2016-12-29 NOTE — Telephone Encounter (Signed)
Patient returning call to Amanda. °

## 2016-12-29 NOTE — Telephone Encounter (Signed)
Patient notified of BMD revealing Osteopenia of hip and spine. Encouraged patient to do weight bearing exerise and to get 3-4 servings of dairy products daily. We will repeat BMD in 2 years.

## 2017-01-03 DIAGNOSIS — T63304A Toxic effect of unspecified spider venom, undetermined, initial encounter: Secondary | ICD-10-CM | POA: Diagnosis not present

## 2017-01-09 DIAGNOSIS — I10 Essential (primary) hypertension: Secondary | ICD-10-CM | POA: Diagnosis not present

## 2017-01-09 DIAGNOSIS — Z6822 Body mass index (BMI) 22.0-22.9, adult: Secondary | ICD-10-CM | POA: Diagnosis not present

## 2017-01-09 DIAGNOSIS — Z0000 Encounter for general adult medical examination without abnormal findings: Secondary | ICD-10-CM | POA: Diagnosis not present

## 2017-01-09 DIAGNOSIS — K429 Umbilical hernia without obstruction or gangrene: Secondary | ICD-10-CM | POA: Diagnosis not present

## 2017-01-09 DIAGNOSIS — G500 Trigeminal neuralgia: Secondary | ICD-10-CM | POA: Diagnosis not present

## 2017-01-09 DIAGNOSIS — Z1389 Encounter for screening for other disorder: Secondary | ICD-10-CM | POA: Diagnosis not present

## 2017-01-09 DIAGNOSIS — L0291 Cutaneous abscess, unspecified: Secondary | ICD-10-CM | POA: Diagnosis not present

## 2017-01-09 DIAGNOSIS — E871 Hypo-osmolality and hyponatremia: Secondary | ICD-10-CM | POA: Diagnosis not present

## 2017-01-09 DIAGNOSIS — E782 Mixed hyperlipidemia: Secondary | ICD-10-CM | POA: Diagnosis not present

## 2017-01-09 DIAGNOSIS — M5489 Other dorsalgia: Secondary | ICD-10-CM | POA: Diagnosis not present

## 2017-01-09 DIAGNOSIS — M79645 Pain in left finger(s): Secondary | ICD-10-CM | POA: Diagnosis not present

## 2017-01-10 DIAGNOSIS — H663X2 Other chronic suppurative otitis media, left ear: Secondary | ICD-10-CM | POA: Diagnosis not present

## 2017-01-12 DIAGNOSIS — L02415 Cutaneous abscess of right lower limb: Secondary | ICD-10-CM | POA: Diagnosis not present

## 2017-01-22 DIAGNOSIS — H5203 Hypermetropia, bilateral: Secondary | ICD-10-CM | POA: Diagnosis not present

## 2017-01-22 DIAGNOSIS — H2513 Age-related nuclear cataract, bilateral: Secondary | ICD-10-CM | POA: Diagnosis not present

## 2017-02-13 DIAGNOSIS — Z23 Encounter for immunization: Secondary | ICD-10-CM | POA: Diagnosis not present

## 2017-02-13 DIAGNOSIS — L570 Actinic keratosis: Secondary | ICD-10-CM | POA: Diagnosis not present

## 2017-02-13 DIAGNOSIS — D225 Melanocytic nevi of trunk: Secondary | ICD-10-CM | POA: Diagnosis not present

## 2017-02-13 DIAGNOSIS — L814 Other melanin hyperpigmentation: Secondary | ICD-10-CM | POA: Diagnosis not present

## 2017-02-13 DIAGNOSIS — D1801 Hemangioma of skin and subcutaneous tissue: Secondary | ICD-10-CM | POA: Diagnosis not present

## 2017-02-13 DIAGNOSIS — L309 Dermatitis, unspecified: Secondary | ICD-10-CM | POA: Diagnosis not present

## 2017-02-13 DIAGNOSIS — Z85828 Personal history of other malignant neoplasm of skin: Secondary | ICD-10-CM | POA: Diagnosis not present

## 2017-02-13 DIAGNOSIS — L821 Other seborrheic keratosis: Secondary | ICD-10-CM | POA: Diagnosis not present

## 2017-02-28 DIAGNOSIS — M1812 Unilateral primary osteoarthritis of first carpometacarpal joint, left hand: Secondary | ICD-10-CM | POA: Diagnosis not present

## 2017-02-28 DIAGNOSIS — M720 Palmar fascial fibromatosis [Dupuytren]: Secondary | ICD-10-CM | POA: Diagnosis not present

## 2017-02-28 DIAGNOSIS — M79642 Pain in left hand: Secondary | ICD-10-CM | POA: Diagnosis not present

## 2017-02-28 DIAGNOSIS — M79641 Pain in right hand: Secondary | ICD-10-CM | POA: Diagnosis not present

## 2017-04-11 DIAGNOSIS — L570 Actinic keratosis: Secondary | ICD-10-CM | POA: Diagnosis not present

## 2017-05-08 DIAGNOSIS — Z8371 Family history of colonic polyps: Secondary | ICD-10-CM | POA: Diagnosis not present

## 2017-05-08 DIAGNOSIS — D126 Benign neoplasm of colon, unspecified: Secondary | ICD-10-CM | POA: Diagnosis not present

## 2017-05-10 DIAGNOSIS — D126 Benign neoplasm of colon, unspecified: Secondary | ICD-10-CM | POA: Diagnosis not present

## 2017-08-16 ENCOUNTER — Other Ambulatory Visit: Payer: Self-pay | Admitting: Internal Medicine

## 2017-08-16 DIAGNOSIS — Z1231 Encounter for screening mammogram for malignant neoplasm of breast: Secondary | ICD-10-CM

## 2017-09-12 ENCOUNTER — Ambulatory Visit
Admission: RE | Admit: 2017-09-12 | Discharge: 2017-09-12 | Disposition: A | Discharge status: Home / Self Care | Payer: Medicare Other | Source: Ambulatory Visit | Attending: Internal Medicine | Admitting: Internal Medicine

## 2017-09-12 DIAGNOSIS — Z1231 Encounter for screening mammogram for malignant neoplasm of breast: Secondary | ICD-10-CM | POA: Diagnosis not present

## 2017-09-17 DIAGNOSIS — I809 Phlebitis and thrombophlebitis of unspecified site: Secondary | ICD-10-CM | POA: Diagnosis not present

## 2017-09-17 DIAGNOSIS — Z6822 Body mass index (BMI) 22.0-22.9, adult: Secondary | ICD-10-CM | POA: Diagnosis not present

## 2017-12-05 ENCOUNTER — Ambulatory Visit (INDEPENDENT_AMBULATORY_CARE_PROVIDER_SITE_OTHER): Payer: Medicare Other | Admitting: Obstetrics and Gynecology

## 2017-12-05 ENCOUNTER — Encounter: Payer: Self-pay | Admitting: Obstetrics and Gynecology

## 2017-12-05 ENCOUNTER — Other Ambulatory Visit: Payer: Self-pay

## 2017-12-05 VITALS — BP 112/62 | HR 72 | Resp 16 | Ht 65.0 in | Wt 136.0 lb

## 2017-12-05 DIAGNOSIS — Z01419 Encounter for gynecological examination (general) (routine) without abnormal findings: Secondary | ICD-10-CM

## 2017-12-05 DIAGNOSIS — Z124 Encounter for screening for malignant neoplasm of cervix: Secondary | ICD-10-CM | POA: Diagnosis not present

## 2017-12-05 NOTE — Patient Instructions (Signed)

## 2017-12-05 NOTE — Progress Notes (Signed)
67 y.o. G84P2002 Married Caucasian female here for annual exam.    Has back issues.  Not exercising.   No vaginal bleeding.  No bladder problems.   Enjoying her grandchildren.  Grandson having surgery.   Has appt with PCP in October.   PCP: Dr. Leanna Battles    Patient's last menstrual period was 04/03/2002 (approximate).           Sexually active: Yes.    The current method of family planning is post menopausal status.    Exercising: No.  The patient does not participate in regular exercise at present. Smoker:  no  Health Maintenance: Pap:  11/16/16 Negative History of abnormal Pap:  Yes, Hx of Leep x 2. 1997 and 2006. 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 S/p LEEP x 2, last one in 2006 showing atypia only. Paps since then MMG:  09/12/17 BIRADS 1 negative/density b Colonoscopy:  February 2019 -- polyps removed f/u 5 years BMD:   12/20/16  Result  Osteopenia, mild.  TDaP: unsure Gardasil:   n/a HIV: never Hep C: never Screening Labs: PCP   reports that she has never smoked. She has never used smokeless tobacco. She reports that she drinks about 6.0 standard drinks of alcohol per week. She reports that she does not use drugs.  Past Medical History:  Diagnosis Date  . Arthritis   . CIN I (cervical intraepithelial neoplasia I)   . Elevated cholesterol   . Hypertension   . Lumbar herniated disc 2013  . Trigeminal neuralgia   . Umbilical hernia     Past Surgical History:  Procedure Laterality Date  . CERVICAL BIOPSY  W/ LOOP ELECTRODE EXCISION  [id number]   X 2  . COLPOSCOPY    . UMBILICAL HERNIA REPAIR      Current Outpatient Medications  Medication Sig Dispense Refill  . carbamazepine (TEGRETOL) 200 MG tablet Take 1.5 tablets by mouth 2 (two) times daily.    . CRESTOR 20 MG tablet Take 1 tablet by mouth daily.    . fluticasone (FLONASE) 50 MCG/ACT nasal spray Place 1 spray into both nostrils as needed.    . Multiple Vitamin (MULTIVITAMIN) tablet Take 1  tablet by mouth daily.    Marland Kitchen neomycin-polymyxin-hydrocortisone (CORTISPORIN) 3.5-10000-1 OTIC suspension as needed.     No current facility-administered medications for this visit.     Family History  Problem Relation Age of Onset  . Colon cancer Father   . Pancreatic cancer Father   . Stomach cancer Father   . Diabetes Maternal Grandmother   . Heart disease Maternal Grandmother   . Hypertension Maternal Grandfather   . Heart disease Maternal Grandfather   . Cancer Mother        Carcinoid syndrome    Review of Systems  All other systems reviewed and are negative.   Exam:   BP 112/62 (BP Location: Right Arm, Patient Position: Sitting, Cuff Size: Normal)   Pulse 72   Resp 16   Ht 5\' 5"  (1.651 m)   Wt 136 lb (61.7 kg)   LMP 04/03/2002 (Approximate)   BMI 22.63 kg/m     General appearance: alert, cooperative and appears stated age Head: Normocephalic, without obvious abnormality, atraumatic Neck: no adenopathy, supple, symmetrical, trachea midline and thyroid normal to inspection and palpation Lungs: clear to auscultation bilaterally Breasts: normal appearance, no masses or tenderness, No nipple retraction or dimpling, No nipple discharge or bleeding, No axillary or supraclavicular adenopathy Heart: regular rate and rhythm Abdomen: soft,  non-tender; no masses, no organomegaly Extremities: extremities normal, atraumatic, no cyanosis or edema Skin: Skin color, texture, turgor normal. No rashes or lesions Lymph nodes: Cervical, supraclavicular, and axillary nodes normal. No abnormal inguinal nodes palpated Neurologic: Grossly normal  Pelvic: External genitalia:  no lesions              Urethra:  normal appearing urethra with no masses, tenderness or lesions              Bartholins and Skenes: normal                 Vagina: normal appearing vagina with normal color and discharge, no lesions              Cervix: no lesions              Pap taken: No. Bimanual Exam:  Uterus:   normal size, contour, position, consistency, mobility, non-tender              Adnexa: no mass, fullness, tenderness              Rectal exam: Yes.  .  Confirms.              Anus:  normal sphincter tone, no lesions  Chaperone was present for exam.  Assessment:   Well woman visit with normal exam. Hx LEEP x 2.  LGSIL and atypia.  Mild osteopenia.  FH gastric/colon CA father and carcinoid in mother.   Plan: Mammogram screening. Recommended self breast awareness. Pap next year. Guidelines for Calcium, Vitamin D, regular exercise program including cardiovascular and weight bearing exercise. BMD next year.    Follow up annually and prn.   After visit summary provided.

## 2017-12-27 DIAGNOSIS — L219 Seborrheic dermatitis, unspecified: Secondary | ICD-10-CM | POA: Diagnosis not present

## 2017-12-27 DIAGNOSIS — L509 Urticaria, unspecified: Secondary | ICD-10-CM | POA: Diagnosis not present

## 2017-12-27 DIAGNOSIS — L309 Dermatitis, unspecified: Secondary | ICD-10-CM | POA: Diagnosis not present

## 2018-01-15 DIAGNOSIS — E782 Mixed hyperlipidemia: Secondary | ICD-10-CM | POA: Diagnosis not present

## 2018-01-15 DIAGNOSIS — R82998 Other abnormal findings in urine: Secondary | ICD-10-CM | POA: Diagnosis not present

## 2018-01-15 DIAGNOSIS — I10 Essential (primary) hypertension: Secondary | ICD-10-CM | POA: Diagnosis not present

## 2018-01-16 DIAGNOSIS — H9222 Otorrhagia, left ear: Secondary | ICD-10-CM | POA: Diagnosis not present

## 2018-01-16 DIAGNOSIS — H7202 Central perforation of tympanic membrane, left ear: Secondary | ICD-10-CM | POA: Diagnosis not present

## 2018-01-18 DIAGNOSIS — Z23 Encounter for immunization: Secondary | ICD-10-CM | POA: Diagnosis not present

## 2018-01-22 DIAGNOSIS — I10 Essential (primary) hypertension: Secondary | ICD-10-CM | POA: Diagnosis not present

## 2018-01-22 DIAGNOSIS — M5489 Other dorsalgia: Secondary | ICD-10-CM | POA: Diagnosis not present

## 2018-01-22 DIAGNOSIS — Z6821 Body mass index (BMI) 21.0-21.9, adult: Secondary | ICD-10-CM | POA: Diagnosis not present

## 2018-01-22 DIAGNOSIS — M542 Cervicalgia: Secondary | ICD-10-CM | POA: Diagnosis not present

## 2018-01-22 DIAGNOSIS — Z1389 Encounter for screening for other disorder: Secondary | ICD-10-CM | POA: Diagnosis not present

## 2018-01-22 DIAGNOSIS — Z0000 Encounter for general adult medical examination without abnormal findings: Secondary | ICD-10-CM | POA: Diagnosis not present

## 2018-01-22 DIAGNOSIS — G500 Trigeminal neuralgia: Secondary | ICD-10-CM | POA: Diagnosis not present

## 2018-01-22 DIAGNOSIS — E871 Hypo-osmolality and hyponatremia: Secondary | ICD-10-CM | POA: Diagnosis not present

## 2018-01-22 DIAGNOSIS — K219 Gastro-esophageal reflux disease without esophagitis: Secondary | ICD-10-CM | POA: Diagnosis not present

## 2018-01-22 DIAGNOSIS — M79645 Pain in left finger(s): Secondary | ICD-10-CM | POA: Diagnosis not present

## 2018-01-22 DIAGNOSIS — E782 Mixed hyperlipidemia: Secondary | ICD-10-CM | POA: Diagnosis not present

## 2018-01-24 DIAGNOSIS — H524 Presbyopia: Secondary | ICD-10-CM | POA: Diagnosis not present

## 2018-01-24 DIAGNOSIS — H2513 Age-related nuclear cataract, bilateral: Secondary | ICD-10-CM | POA: Diagnosis not present

## 2018-01-24 DIAGNOSIS — H5203 Hypermetropia, bilateral: Secondary | ICD-10-CM | POA: Diagnosis not present

## 2018-02-18 DIAGNOSIS — L219 Seborrheic dermatitis, unspecified: Secondary | ICD-10-CM | POA: Diagnosis not present

## 2018-02-18 DIAGNOSIS — L309 Dermatitis, unspecified: Secondary | ICD-10-CM | POA: Diagnosis not present

## 2018-02-18 DIAGNOSIS — Z23 Encounter for immunization: Secondary | ICD-10-CM | POA: Diagnosis not present

## 2018-02-18 DIAGNOSIS — L0889 Other specified local infections of the skin and subcutaneous tissue: Secondary | ICD-10-CM | POA: Diagnosis not present

## 2018-04-08 DIAGNOSIS — L219 Seborrheic dermatitis, unspecified: Secondary | ICD-10-CM | POA: Diagnosis not present

## 2018-04-08 DIAGNOSIS — D485 Neoplasm of uncertain behavior of skin: Secondary | ICD-10-CM | POA: Diagnosis not present

## 2018-04-08 DIAGNOSIS — L308 Other specified dermatitis: Secondary | ICD-10-CM | POA: Diagnosis not present

## 2018-04-08 DIAGNOSIS — Z23 Encounter for immunization: Secondary | ICD-10-CM | POA: Diagnosis not present

## 2018-04-12 DIAGNOSIS — J329 Chronic sinusitis, unspecified: Secondary | ICD-10-CM | POA: Diagnosis not present

## 2018-04-12 DIAGNOSIS — Z6821 Body mass index (BMI) 21.0-21.9, adult: Secondary | ICD-10-CM | POA: Diagnosis not present

## 2018-04-12 DIAGNOSIS — R05 Cough: Secondary | ICD-10-CM | POA: Diagnosis not present

## 2018-06-11 DIAGNOSIS — G500 Trigeminal neuralgia: Secondary | ICD-10-CM | POA: Diagnosis not present

## 2018-06-11 DIAGNOSIS — J0100 Acute maxillary sinusitis, unspecified: Secondary | ICD-10-CM | POA: Diagnosis not present

## 2018-06-11 DIAGNOSIS — Z6822 Body mass index (BMI) 22.0-22.9, adult: Secondary | ICD-10-CM | POA: Diagnosis not present

## 2018-09-10 DIAGNOSIS — L219 Seborrheic dermatitis, unspecified: Secondary | ICD-10-CM | POA: Diagnosis not present

## 2018-09-10 DIAGNOSIS — D485 Neoplasm of uncertain behavior of skin: Secondary | ICD-10-CM | POA: Diagnosis not present

## 2018-09-10 DIAGNOSIS — L820 Inflamed seborrheic keratosis: Secondary | ICD-10-CM | POA: Diagnosis not present

## 2018-09-10 DIAGNOSIS — L821 Other seborrheic keratosis: Secondary | ICD-10-CM | POA: Diagnosis not present

## 2018-09-27 ENCOUNTER — Ambulatory Visit
Admission: RE | Admit: 2018-09-27 | Discharge: 2018-09-27 | Disposition: A | Discharge status: Home / Self Care | Payer: Medicare Other | Source: Ambulatory Visit | Attending: Internal Medicine | Admitting: Internal Medicine

## 2018-09-27 ENCOUNTER — Other Ambulatory Visit: Payer: Self-pay | Admitting: Internal Medicine

## 2018-09-27 ENCOUNTER — Other Ambulatory Visit: Payer: Self-pay

## 2018-09-27 DIAGNOSIS — R519 Headache, unspecified: Secondary | ICD-10-CM

## 2018-09-27 DIAGNOSIS — M26602 Left temporomandibular joint disorder, unspecified: Secondary | ICD-10-CM | POA: Diagnosis not present

## 2018-09-27 DIAGNOSIS — J3489 Other specified disorders of nose and nasal sinuses: Secondary | ICD-10-CM | POA: Diagnosis not present

## 2018-09-27 DIAGNOSIS — G500 Trigeminal neuralgia: Secondary | ICD-10-CM | POA: Diagnosis not present

## 2018-09-27 DIAGNOSIS — R51 Headache: Secondary | ICD-10-CM | POA: Diagnosis not present

## 2018-09-27 MED ORDER — IOPAMIDOL (ISOVUE-300) INJECTION 61%
75.0000 mL | Freq: Once | INTRAVENOUS | Status: AC | PRN
  Administered 2018-09-27: 75 mL via INTRAVENOUS

## 2018-11-25 ENCOUNTER — Other Ambulatory Visit: Payer: Self-pay | Admitting: Obstetrics and Gynecology

## 2018-11-25 DIAGNOSIS — Z1231 Encounter for screening mammogram for malignant neoplasm of breast: Secondary | ICD-10-CM

## 2018-11-26 ENCOUNTER — Ambulatory Visit
Admission: RE | Admit: 2018-11-26 | Discharge: 2018-11-26 | Disposition: A | Discharge status: Home / Self Care | Payer: Medicare Other | Source: Ambulatory Visit | Attending: Obstetrics and Gynecology | Admitting: Obstetrics and Gynecology

## 2018-11-26 ENCOUNTER — Other Ambulatory Visit: Payer: Self-pay

## 2018-11-26 DIAGNOSIS — Z1231 Encounter for screening mammogram for malignant neoplasm of breast: Secondary | ICD-10-CM

## 2018-12-06 ENCOUNTER — Other Ambulatory Visit: Payer: Self-pay

## 2018-12-10 NOTE — Progress Notes (Signed)
68 y.o. G24P2002 Married Caucasian female here for annual exam.    Denies vaginal bleeding.  No bladder or bowel issues.   She fell and is having left foot pain.  Sees [facility].  Enjoying her grand children.   She will see her PCP in October.  She just did her second shingles vaccine.   PCP: Leanna Battles, MD   Patient's last menstrual period was 04/03/2002 (approximate).           Sexually active: No.  The current method of family planning is post menopausal status.    Exercising: No.  The patient does not participate in regular exercise at present. Smoker:  no  Health Maintenance: Pap:  11/16/16 Negative, 10-14-14 Neg History of abnormal Pap:  Yes, Yes,Hx of Leep x 2. 1997 and 2006. 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 S/p LEEP x 2, last one in 2006 showing atypia only. Paps since then. MMG:  11-26-18 3D Neg/density C/BiRads1 Colonoscopy:  2018 polyp;next 5 years BMD: 12-20-16  Result :Osteopenia TDaP: PCP. Gardasil:   n/a HIV: never Hep C: never Screening Labs:  PCP.    reports that she has never smoked. She has never used smokeless tobacco. She reports current alcohol use of about 6.0 standard drinks of alcohol per week. She reports that she does not use drugs.  Past Medical History:  Diagnosis Date  . Arthritis   . CIN I (cervical intraepithelial neoplasia I)   . Elevated cholesterol   . Hypertension   . Lumbar herniated disc 2013  . Trigeminal neuralgia   . Umbilical hernia     Past Surgical History:  Procedure Laterality Date  . CERVICAL BIOPSY  W/ LOOP ELECTRODE EXCISION  [id number]   X 2  . COLPOSCOPY    . UMBILICAL HERNIA REPAIR      Current Outpatient Medications  Medication Sig Dispense Refill  . carbamazepine (TEGRETOL) 200 MG tablet Take 1.5 tablets by mouth 2 (two) times daily.    . Cholecalciferol (VITAMIN D3) 25 MCG (1000 UT) CAPS Take 1 capsule by mouth daily.    . fluticasone (FLONASE) 50 MCG/ACT nasal spray Place 1 spray  into both nostrils as needed.    . Magnesium 100 MG CAPS Take 1 tablet by mouth daily.    . Multiple Vitamin (MULTIVITAMIN) tablet Take 1 tablet by mouth daily.    Marland Kitchen neomycin-polymyxin-hydrocortisone (CORTISPORIN) 3.5-10000-1 OTIC suspension as needed.    . Probiotic Product (PROBIOTIC-10 ULTIMATE PO) Take 1 tablet by mouth daily.    . propranolol (INDERAL) 10 MG tablet Take 1 tablet by mouth daily.    . rosuvastatin (CRESTOR) 10 MG tablet Take 1 tablet by mouth daily.     No current facility-administered medications for this visit.     Family History  Problem Relation Age of Onset  . Colon cancer Father   . Pancreatic cancer Father   . Stomach cancer Father   . Diabetes Maternal Grandmother   . Heart disease Maternal Grandmother   . Hypertension Maternal Grandfather   . Heart disease Maternal Grandfather   . Cancer Mother        Carcinoid syndrome    Review of Systems  All other systems reviewed and are negative.   Exam:   BP (!) 148/86   Pulse 68   Temp (!) 97.3 F (36.3 C) (Temporal)   Resp 14   Ht 5\' 5"  (1.651 m)   Wt 138 lb 3.2 oz (62.7 kg)   LMP 04/03/2002 (Approximate)  BMI 23.00 kg/m     General appearance: alert, cooperative and appears stated age Head: normocephalic, without obvious abnormality, atraumatic Neck: no adenopathy, supple, symmetrical, trachea midline and thyroid normal to inspection and palpation Lungs: clear to auscultation bilaterally Breasts: normal appearance, no masses or tenderness, No nipple retraction or dimpling, No nipple discharge or bleeding, No axillary adenopathy Heart: regular rate and rhythm Abdomen: soft, non-tender; no masses, no organomegaly Extremities: extremities normal, atraumatic, no cyanosis or edema Skin: skin color, texture, turgor normal. No rashes or lesions Lymph nodes: cervical, supraclavicular, and axillary nodes normal. Neurologic: grossly normal  Pelvic: External genitalia:  no lesions              No  abnormal inguinal nodes palpated.              Urethra:  normal appearing urethra with no masses, tenderness or lesions              Bartholins and Skenes: normal                 Vagina: normal appearing vagina with normal color and discharge, no lesions              Cervix: no lesions              Pap taken: Yes.   Bimanual Exam:  Uterus:  normal size, contour, position, consistency, mobility, non-tender              Adnexa: no mass, fullness, tenderness              Rectal exam: Yes.  .  Confirms.              Anus:  normal sphincter tone, no lesions  Chaperone was present for exam.  Assessment:   Well woman visit with normal exam. Hx LEEP x 2. LGSIL and atypia.  Mild osteopenia.  FH gastric/colon CA father and carcinoid in mother.  Plan: Mammogram screening discussed. Self breast awareness reviewed. Pap and HR HPV as above. Guidelines for Calcium, Vitamin D, regular exercise program including cardiovascular and weight bearing exercise. Flu vaccine recommended.  BMD ordered.   Follow up annually and prn.    After visit summary provided.

## 2018-12-11 ENCOUNTER — Encounter: Payer: Self-pay | Admitting: Obstetrics and Gynecology

## 2018-12-11 ENCOUNTER — Other Ambulatory Visit: Payer: Self-pay

## 2018-12-11 ENCOUNTER — Ambulatory Visit (INDEPENDENT_AMBULATORY_CARE_PROVIDER_SITE_OTHER): Payer: Medicare Other | Admitting: Obstetrics and Gynecology

## 2018-12-11 ENCOUNTER — Other Ambulatory Visit (HOSPITAL_COMMUNITY)
Admission: RE | Admit: 2018-12-11 | Discharge: 2018-12-11 | Disposition: A | Discharge status: Home / Self Care | Payer: Medicare Other | Source: Ambulatory Visit | Attending: Obstetrics and Gynecology | Admitting: Obstetrics and Gynecology

## 2018-12-11 VITALS — BP 148/86 | HR 68 | Temp 97.3°F | Resp 14 | Ht 65.0 in | Wt 138.2 lb

## 2018-12-11 DIAGNOSIS — Z01419 Encounter for gynecological examination (general) (routine) without abnormal findings: Secondary | ICD-10-CM | POA: Insufficient documentation

## 2018-12-11 DIAGNOSIS — Z780 Asymptomatic menopausal state: Secondary | ICD-10-CM | POA: Diagnosis not present

## 2018-12-11 DIAGNOSIS — Z124 Encounter for screening for malignant neoplasm of cervix: Secondary | ICD-10-CM | POA: Diagnosis not present

## 2018-12-11 NOTE — Patient Instructions (Signed)

## 2018-12-12 LAB — CYTOLOGY - PAP: Diagnosis: NEGATIVE

## 2018-12-16 DIAGNOSIS — M79672 Pain in left foot: Secondary | ICD-10-CM | POA: Diagnosis not present

## 2018-12-16 DIAGNOSIS — S92355A Nondisplaced fracture of fifth metatarsal bone, left foot, initial encounter for closed fracture: Secondary | ICD-10-CM | POA: Diagnosis not present

## 2018-12-16 DIAGNOSIS — M25572 Pain in left ankle and joints of left foot: Secondary | ICD-10-CM | POA: Diagnosis not present

## 2019-01-17 DIAGNOSIS — Z23 Encounter for immunization: Secondary | ICD-10-CM | POA: Diagnosis not present

## 2019-01-20 DIAGNOSIS — M79672 Pain in left foot: Secondary | ICD-10-CM | POA: Diagnosis not present

## 2019-01-20 DIAGNOSIS — S92355D Nondisplaced fracture of fifth metatarsal bone, left foot, subsequent encounter for fracture with routine healing: Secondary | ICD-10-CM | POA: Diagnosis not present

## 2019-01-27 DIAGNOSIS — E782 Mixed hyperlipidemia: Secondary | ICD-10-CM | POA: Diagnosis not present

## 2019-01-29 DIAGNOSIS — I10 Essential (primary) hypertension: Secondary | ICD-10-CM | POA: Diagnosis not present

## 2019-01-29 DIAGNOSIS — R82998 Other abnormal findings in urine: Secondary | ICD-10-CM | POA: Diagnosis not present

## 2019-01-31 ENCOUNTER — Other Ambulatory Visit: Payer: Self-pay | Admitting: Obstetrics and Gynecology

## 2019-01-31 DIAGNOSIS — E2839 Other primary ovarian failure: Secondary | ICD-10-CM

## 2019-02-03 DIAGNOSIS — E871 Hypo-osmolality and hyponatremia: Secondary | ICD-10-CM | POA: Diagnosis not present

## 2019-02-03 DIAGNOSIS — Z1339 Encounter for screening examination for other mental health and behavioral disorders: Secondary | ICD-10-CM | POA: Diagnosis not present

## 2019-02-03 DIAGNOSIS — G4700 Insomnia, unspecified: Secondary | ICD-10-CM | POA: Diagnosis not present

## 2019-02-03 DIAGNOSIS — D72819 Decreased white blood cell count, unspecified: Secondary | ICD-10-CM | POA: Diagnosis not present

## 2019-02-03 DIAGNOSIS — K219 Gastro-esophageal reflux disease without esophagitis: Secondary | ICD-10-CM | POA: Diagnosis not present

## 2019-02-03 DIAGNOSIS — E782 Mixed hyperlipidemia: Secondary | ICD-10-CM | POA: Diagnosis not present

## 2019-02-03 DIAGNOSIS — I10 Essential (primary) hypertension: Secondary | ICD-10-CM | POA: Diagnosis not present

## 2019-02-03 DIAGNOSIS — Z0000 Encounter for general adult medical examination without abnormal findings: Secondary | ICD-10-CM | POA: Diagnosis not present

## 2019-02-03 DIAGNOSIS — M5489 Other dorsalgia: Secondary | ICD-10-CM | POA: Diagnosis not present

## 2019-02-03 DIAGNOSIS — G500 Trigeminal neuralgia: Secondary | ICD-10-CM | POA: Diagnosis not present

## 2019-02-03 DIAGNOSIS — Z1331 Encounter for screening for depression: Secondary | ICD-10-CM | POA: Diagnosis not present

## 2019-02-04 ENCOUNTER — Ambulatory Visit
Admission: RE | Admit: 2019-02-04 | Discharge: 2019-02-04 | Disposition: A | Discharge status: Home / Self Care | Payer: Medicare Other | Source: Ambulatory Visit | Attending: Obstetrics and Gynecology | Admitting: Obstetrics and Gynecology

## 2019-02-04 ENCOUNTER — Other Ambulatory Visit: Payer: Self-pay

## 2019-02-04 ENCOUNTER — Other Ambulatory Visit: Payer: Medicare Other

## 2019-02-04 DIAGNOSIS — M8589 Other specified disorders of bone density and structure, multiple sites: Secondary | ICD-10-CM | POA: Diagnosis not present

## 2019-02-04 DIAGNOSIS — E2839 Other primary ovarian failure: Secondary | ICD-10-CM

## 2019-02-04 DIAGNOSIS — Z780 Asymptomatic menopausal state: Secondary | ICD-10-CM | POA: Diagnosis not present

## 2019-05-30 ENCOUNTER — Ambulatory Visit: Payer: Medicare Other

## 2019-06-03 DIAGNOSIS — J3489 Other specified disorders of nose and nasal sinuses: Secondary | ICD-10-CM | POA: Diagnosis not present

## 2019-06-03 DIAGNOSIS — J329 Chronic sinusitis, unspecified: Secondary | ICD-10-CM | POA: Diagnosis not present

## 2019-06-03 DIAGNOSIS — H7202 Central perforation of tympanic membrane, left ear: Secondary | ICD-10-CM | POA: Diagnosis not present

## 2019-06-03 DIAGNOSIS — R519 Headache, unspecified: Secondary | ICD-10-CM | POA: Diagnosis not present

## 2019-07-03 DIAGNOSIS — R05 Cough: Secondary | ICD-10-CM | POA: Diagnosis not present

## 2019-07-03 DIAGNOSIS — Z1152 Encounter for screening for COVID-19: Secondary | ICD-10-CM | POA: Diagnosis not present

## 2019-07-03 DIAGNOSIS — J019 Acute sinusitis, unspecified: Secondary | ICD-10-CM | POA: Diagnosis not present

## 2019-07-21 DIAGNOSIS — H02834 Dermatochalasis of left upper eyelid: Secondary | ICD-10-CM | POA: Diagnosis not present

## 2019-07-21 DIAGNOSIS — H2513 Age-related nuclear cataract, bilateral: Secondary | ICD-10-CM | POA: Diagnosis not present

## 2019-07-21 DIAGNOSIS — H02831 Dermatochalasis of right upper eyelid: Secondary | ICD-10-CM | POA: Diagnosis not present

## 2019-07-21 DIAGNOSIS — H5203 Hypermetropia, bilateral: Secondary | ICD-10-CM | POA: Diagnosis not present

## 2019-08-19 DIAGNOSIS — D0472 Carcinoma in situ of skin of left lower limb, including hip: Secondary | ICD-10-CM | POA: Diagnosis not present

## 2019-08-19 DIAGNOSIS — D485 Neoplasm of uncertain behavior of skin: Secondary | ICD-10-CM | POA: Diagnosis not present

## 2019-08-19 DIAGNOSIS — L570 Actinic keratosis: Secondary | ICD-10-CM | POA: Diagnosis not present

## 2019-09-27 DIAGNOSIS — G459 Transient cerebral ischemic attack, unspecified: Secondary | ICD-10-CM | POA: Diagnosis not present

## 2019-09-27 DIAGNOSIS — E785 Hyperlipidemia, unspecified: Secondary | ICD-10-CM | POA: Diagnosis not present

## 2019-09-27 DIAGNOSIS — R42 Dizziness and giddiness: Secondary | ICD-10-CM | POA: Diagnosis not present

## 2019-09-27 DIAGNOSIS — R270 Ataxia, unspecified: Secondary | ICD-10-CM | POA: Diagnosis not present

## 2019-09-28 DIAGNOSIS — G459 Transient cerebral ischemic attack, unspecified: Secondary | ICD-10-CM | POA: Diagnosis not present

## 2019-09-28 DIAGNOSIS — E785 Hyperlipidemia, unspecified: Secondary | ICD-10-CM | POA: Diagnosis not present

## 2019-09-29 DIAGNOSIS — E785 Hyperlipidemia, unspecified: Secondary | ICD-10-CM | POA: Diagnosis not present

## 2019-09-29 DIAGNOSIS — G459 Transient cerebral ischemic attack, unspecified: Secondary | ICD-10-CM | POA: Diagnosis not present

## 2019-10-21 DIAGNOSIS — H02831 Dermatochalasis of right upper eyelid: Secondary | ICD-10-CM | POA: Diagnosis not present

## 2019-10-21 DIAGNOSIS — H02834 Dermatochalasis of left upper eyelid: Secondary | ICD-10-CM | POA: Diagnosis not present

## 2019-10-21 DIAGNOSIS — D210 Benign neoplasm of connective and other soft tissue of head, face and neck: Secondary | ICD-10-CM | POA: Diagnosis not present

## 2019-10-21 DIAGNOSIS — H01134 Eczematous dermatitis of left upper eyelid: Secondary | ICD-10-CM | POA: Diagnosis not present

## 2019-10-23 ENCOUNTER — Other Ambulatory Visit: Payer: Self-pay | Admitting: Obstetrics and Gynecology

## 2019-10-23 DIAGNOSIS — Z1231 Encounter for screening mammogram for malignant neoplasm of breast: Secondary | ICD-10-CM

## 2019-10-27 DIAGNOSIS — H7292 Unspecified perforation of tympanic membrane, left ear: Secondary | ICD-10-CM | POA: Diagnosis not present

## 2019-10-27 DIAGNOSIS — H7202 Central perforation of tympanic membrane, left ear: Secondary | ICD-10-CM | POA: Diagnosis not present

## 2019-10-29 DIAGNOSIS — D0472 Carcinoma in situ of skin of left lower limb, including hip: Secondary | ICD-10-CM | POA: Diagnosis not present

## 2019-10-30 DIAGNOSIS — G500 Trigeminal neuralgia: Secondary | ICD-10-CM | POA: Diagnosis not present

## 2019-10-30 DIAGNOSIS — R202 Paresthesia of skin: Secondary | ICD-10-CM | POA: Diagnosis not present

## 2019-10-30 DIAGNOSIS — H8110 Benign paroxysmal vertigo, unspecified ear: Secondary | ICD-10-CM | POA: Diagnosis not present

## 2019-12-02 ENCOUNTER — Ambulatory Visit: Payer: Medicare Other

## 2019-12-10 ENCOUNTER — Other Ambulatory Visit: Payer: Self-pay

## 2019-12-10 ENCOUNTER — Ambulatory Visit
Admission: RE | Admit: 2019-12-10 | Discharge: 2019-12-10 | Disposition: A | Discharge status: Home / Self Care | Payer: Medicare Other | Source: Ambulatory Visit | Attending: Obstetrics and Gynecology | Admitting: Obstetrics and Gynecology

## 2019-12-10 DIAGNOSIS — Z1231 Encounter for screening mammogram for malignant neoplasm of breast: Secondary | ICD-10-CM | POA: Diagnosis not present

## 2019-12-16 ENCOUNTER — Ambulatory Visit: Payer: Medicare Other | Admitting: Obstetrics and Gynecology

## 2019-12-16 ENCOUNTER — Other Ambulatory Visit: Payer: Self-pay

## 2019-12-16 NOTE — Progress Notes (Deleted)
69 y.o. G80P2002 Married Caucasian female here for annual exam.    PCP:     Patient's last menstrual period was 04/03/2002 (approximate).           Sexually active: {yes no:314532}  The current method of family planning is post menopausal status.    Exercising: {yes no:314532}  {types:19826} Smoker:  no  Health Maintenance: Pap:  11/16/16 Negative, 10-14-14 Neg History of abnormal Pap:  Yes, hx of LEEP x2. 1997 and 2006. LGSIL recurrently from 2004 until 2006. S/p LEEP x2, last one in 2006 showing atypia only. Paps normal since then. MMG:  12/10/19 BIRADS 1 negative/density b Colonoscopy:  2018 polyp f/u 5 years BMD:   02/04/19  Result  Osteopenia TDaP:  *** Gardasil:   n/a HIV: never Hep C: never Screening Labs:  PCP   reports that she has never smoked. She has never used smokeless tobacco. She reports current alcohol use of about 6.0 standard drinks of alcohol per week. She reports that she does not use drugs.  Past Medical History:  Diagnosis Date  . Arthritis   . CIN I (cervical intraepithelial neoplasia I)   . Elevated cholesterol   . Hypertension   . Lumbar herniated disc 2013  . Trigeminal neuralgia   . Umbilical hernia     Past Surgical History:  Procedure Laterality Date  . CERVICAL BIOPSY  W/ LOOP ELECTRODE EXCISION  [id number]   X 2  . COLPOSCOPY    . UMBILICAL HERNIA REPAIR      Current Outpatient Medications  Medication Sig Dispense Refill  . carbamazepine (TEGRETOL) 200 MG tablet Take 1.5 tablets by mouth 2 (two) times daily.    . Cholecalciferol (VITAMIN D3) 25 MCG (1000 UT) CAPS Take 1 capsule by mouth daily.    . fluticasone (FLONASE) 50 MCG/ACT nasal spray Place 1 spray into both nostrils as needed.    . Magnesium 100 MG CAPS Take 1 tablet by mouth daily.    . Multiple Vitamin (MULTIVITAMIN) tablet Take 1 tablet by mouth daily.    Marland Kitchen neomycin-polymyxin-hydrocortisone (CORTISPORIN) 3.5-10000-1 OTIC suspension as needed.    . Probiotic Product  (PROBIOTIC-10 ULTIMATE PO) Take 1 tablet by mouth daily.    . propranolol (INDERAL) 10 MG tablet Take 1 tablet by mouth daily.    . rosuvastatin (CRESTOR) 10 MG tablet Take 1 tablet by mouth daily.     No current facility-administered medications for this visit.    Family History  Problem Relation Age of Onset  . Colon cancer Father   . Pancreatic cancer Father   . Stomach cancer Father   . Diabetes Maternal Grandmother   . Heart disease Maternal Grandmother   . Hypertension Maternal Grandfather   . Heart disease Maternal Grandfather   . Cancer Mother        Carcinoid syndrome    Review of Systems  Exam:   LMP 04/03/2002 (Approximate)     General appearance: alert, cooperative and appears stated age Head: normocephalic, without obvious abnormality, atraumatic Neck: no adenopathy, supple, symmetrical, trachea midline and thyroid normal to inspection and palpation Lungs: clear to auscultation bilaterally Breasts: normal appearance, no masses or tenderness, No nipple retraction or dimpling, No nipple discharge or bleeding, No axillary adenopathy Heart: regular rate and rhythm Abdomen: soft, non-tender; no masses, no organomegaly Extremities: extremities normal, atraumatic, no cyanosis or edema Skin: skin color, texture, turgor normal. No rashes or lesions Lymph nodes: cervical, supraclavicular, and axillary nodes normal. Neurologic: grossly normal  Pelvic: External  genitalia:  no lesions              No abnormal inguinal nodes palpated.              Urethra:  normal appearing urethra with no masses, tenderness or lesions              Bartholins and Skenes: normal                 Vagina: normal appearing vagina with normal color and discharge, no lesions              Cervix: no lesions              Pap taken: {yes no:314532} Bimanual Exam:  Uterus:  normal size, contour, position, consistency, mobility, non-tender              Adnexa: no mass, fullness, tenderness               Rectal exam: {yes no:314532}.  Confirms.              Anus:  normal sphincter tone, no lesions  Chaperone was present for exam.  Assessment:   Well woman visit with normal exam.   Plan: Mammogram screening discussed. Self breast awareness reviewed. Pap and HR HPV as above. Guidelines for Calcium, Vitamin D, regular exercise program including cardiovascular and weight bearing exercise.   Follow up annually and prn.   Additional counseling given.  {yes [id number]. _______ minutes face to face time of which over 50% was spent in counseling.    After visit summary provided.

## 2019-12-18 DIAGNOSIS — J069 Acute upper respiratory infection, unspecified: Secondary | ICD-10-CM | POA: Diagnosis not present

## 2019-12-18 DIAGNOSIS — Z20822 Contact with and (suspected) exposure to covid-19: Secondary | ICD-10-CM | POA: Diagnosis not present

## 2019-12-18 DIAGNOSIS — R05 Cough: Secondary | ICD-10-CM | POA: Diagnosis not present

## 2019-12-19 DIAGNOSIS — R21 Rash and other nonspecific skin eruption: Secondary | ICD-10-CM | POA: Diagnosis not present

## 2019-12-23 DIAGNOSIS — H7292 Unspecified perforation of tympanic membrane, left ear: Secondary | ICD-10-CM | POA: Diagnosis not present

## 2019-12-29 DIAGNOSIS — I10 Essential (primary) hypertension: Secondary | ICD-10-CM | POA: Diagnosis not present

## 2019-12-29 DIAGNOSIS — R21 Rash and other nonspecific skin eruption: Secondary | ICD-10-CM | POA: Diagnosis not present

## 2019-12-29 DIAGNOSIS — G500 Trigeminal neuralgia: Secondary | ICD-10-CM | POA: Diagnosis not present

## 2019-12-29 NOTE — Progress Notes (Signed)
69 y.o. G4P2002 Married Caucasian female here for annual exam.    Patient scheduled for AEX with PCP 02/10/20.  Being treated for contact dermatitis.  Saw dermatology who thinks it is her shampoo.  Denies any vaginal bleeding.   Received Covid vaccination.   PCP: Leanna Battles, MD    Patient's last menstrual period was 04/03/2002 (approximate).           Sexually active: No.  The current method of family planning is post menopausal status.    Exercising: No.  The patient does not participate in regular exercise at present. Smoker:  no  Health Maintenance: Pap:12-11-18 Neg, 11/16/16 Neg, 10-14-14 Neg History of abnormal Pap:  Yes, Hx of Leep x 2. 1997 and 2006. 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 S/p LEEP x 2, last one in 2006 showing atypia only. Paps since then. MMG: 12-10-19  3D/Neg/density B/biRads1 Colonoscopy:  2018 polyp;next 5 years BMD:  02/04/19 - Result :Osteopenia of hip and spine TDaP:  PCP Gardasil:   no NID:POEUM Hep C:never Screening Labs:  PCP Shingrix:  Completed.  Pneumonia:  Completed one.    reports that she has never smoked. She has never used smokeless tobacco. She reports current alcohol use of about 6.0 standard drinks of alcohol per week. She reports that she does not use drugs.  Past Medical History:  Diagnosis Date  . Arthritis   . CIN I (cervical intraepithelial neoplasia I)   . Elevated cholesterol   . Hypertension   . Lumbar herniated disc 2013  . Trigeminal neuralgia   . Umbilical hernia     Past Surgical History:  Procedure Laterality Date  . CERVICAL BIOPSY  W/ LOOP ELECTRODE EXCISION  [id number]   X 2  . COLPOSCOPY    . UMBILICAL HERNIA REPAIR      Current Outpatient Medications  Medication Sig Dispense Refill  . ASPIRIN LOW DOSE 81 MG EC tablet Take 81 mg by mouth daily.    . betamethasone dipropionate 0.05 % cream Apply topically.    . carbamazepine (TEGRETOL) 200 MG tablet Take 1.5 tablets by mouth 2 (two) times  daily.    . Cholecalciferol (VITAMIN D3) 25 MCG (1000 UT) CAPS Take 1 capsule by mouth daily.    . fluorouracil (EFUDEX) 5 % cream Apply 1 application topically 2 (two) times daily.    . fluticasone (FLONASE) 50 MCG/ACT nasal spray Place 1 spray into both nostrils as needed.    . hydrOXYzine (ATARAX/VISTARIL) 25 MG tablet Take by mouth.    . Magnesium 100 MG CAPS Take 1 tablet by mouth daily.    . Multiple Vitamin (MULTIVITAMIN) tablet Take 1 tablet by mouth daily.    Marland Kitchen neomycin-polymyxin-hydrocortisone (CORTISPORIN) 3.5-10000-1 OTIC suspension as needed.    . Probiotic Product (PROBIOTIC-10 ULTIMATE PO) Take 1 tablet by mouth daily.    . propranolol (INDERAL) 10 MG tablet Take 1 tablet by mouth daily.    . rosuvastatin (CRESTOR) 10 MG tablet Take 1 tablet by mouth daily.    . meclizine (ANTIVERT) 25 MG tablet Take by mouth. (Patient not taking: Reported on 12/30/2019)     No current facility-administered medications for this visit.    Family History  Problem Relation Age of Onset  . Colon cancer Father   . Pancreatic cancer Father   . Stomach cancer Father   . Diabetes Maternal Grandmother   . Heart disease Maternal Grandmother   . Hypertension Maternal Grandfather   . Heart disease Maternal Grandfather   .  Cancer Mother        Carcinoid syndrome    Review of Systems  Constitutional: Negative.   HENT: Negative.   Eyes: Negative.   Respiratory: Negative.   Cardiovascular: Negative.   Gastrointestinal: Negative.   Endocrine: Negative.   Genitourinary: Negative.   Musculoskeletal: Negative.   Skin: Negative.   Allergic/Immunologic: Negative.   Neurological: Negative.   Hematological: Negative.   Psychiatric/Behavioral: Negative.     Exam:   BP 112/78 (BP Location: Right Arm, Patient Position: Sitting, Cuff Size: Normal)   Pulse 76   Resp 14   Ht 5\' 5"  (1.651 m)   Wt 135 lb (61.2 kg)   LMP 04/03/2002 (Approximate)   BMI 22.47 kg/m     General appearance: alert,  cooperative and appears stated age Head: normocephalic, without obvious abnormality, atraumatic Neck: no adenopathy, supple, symmetrical, trachea midline and thyroid normal to inspection and palpation Lungs: clear to auscultation bilaterally Breasts: normal appearance, no masses or tenderness, No nipple retraction or dimpling, No nipple discharge or bleeding, No axillary adenopathy Heart: regular rate and rhythm Abdomen: soft, non-tender; no masses, no organomegaly Extremities: extremities normal, atraumatic, no cyanosis or edema Skin: skin color, texture, turgor normal. No rashes or lesions Lymph nodes: cervical, supraclavicular, and axillary nodes normal. Neurologic: grossly normal  Pelvic: External genitalia:  no lesions              No abnormal inguinal nodes palpated.              Urethra:  normal appearing urethra with no masses, tenderness or lesions              Bartholins and Skenes: normal                 Vagina: normal appearing vagina with normal color and discharge, no lesions              Cervix: no lesions              Pap taken: No. Bimanual Exam:  Uterus:  normal size, contour, position, consistency, mobility, non-tender              Adnexa: no mass, fullness, tenderness              Rectal exam: Yes.  .  Confirms.              Anus:  normal sphincter tone, no lesions  Chaperone was present for exam.  Assessment:   Well woman visit with normal exam. Hx LEEP x 2. LGSIL and atypia.  Mild osteopenia.  FH gastric/colon CA father and carcinoid in mother.  Plan: Mammogram screening discussed. Self breast awareness reviewed. Pap and HR HPV as above. Guidelines for Calcium, Vitamin D, regular exercise program including cardiovascular and weight bearing exercise. BMD 2022. Flu vaccine recommended.  Follow up annually and prn.

## 2019-12-30 ENCOUNTER — Encounter: Payer: Self-pay | Admitting: Obstetrics and Gynecology

## 2019-12-30 ENCOUNTER — Other Ambulatory Visit: Payer: Self-pay

## 2019-12-30 ENCOUNTER — Ambulatory Visit (INDEPENDENT_AMBULATORY_CARE_PROVIDER_SITE_OTHER): Payer: Medicare Other | Admitting: Obstetrics and Gynecology

## 2019-12-30 VITALS — BP 112/78 | HR 76 | Resp 14 | Ht 65.0 in | Wt 135.0 lb

## 2019-12-30 DIAGNOSIS — Z01419 Encounter for gynecological examination (general) (routine) without abnormal findings: Secondary | ICD-10-CM

## 2019-12-30 DIAGNOSIS — L239 Allergic contact dermatitis, unspecified cause: Secondary | ICD-10-CM | POA: Diagnosis not present

## 2020-01-01 NOTE — Patient Instructions (Signed)

## 2020-01-02 DIAGNOSIS — M2012 Hallux valgus (acquired), left foot: Secondary | ICD-10-CM | POA: Diagnosis not present

## 2020-01-02 DIAGNOSIS — G609 Hereditary and idiopathic neuropathy, unspecified: Secondary | ICD-10-CM | POA: Diagnosis not present

## 2020-01-02 DIAGNOSIS — M21611 Bunion of right foot: Secondary | ICD-10-CM | POA: Diagnosis not present

## 2020-01-02 DIAGNOSIS — M79672 Pain in left foot: Secondary | ICD-10-CM | POA: Diagnosis not present

## 2020-01-31 DIAGNOSIS — Z23 Encounter for immunization: Secondary | ICD-10-CM | POA: Diagnosis not present

## 2020-02-03 DIAGNOSIS — E782 Mixed hyperlipidemia: Secondary | ICD-10-CM | POA: Diagnosis not present

## 2020-02-05 DIAGNOSIS — L309 Dermatitis, unspecified: Secondary | ICD-10-CM | POA: Diagnosis not present

## 2020-02-10 DIAGNOSIS — Z0000 Encounter for general adult medical examination without abnormal findings: Secondary | ICD-10-CM | POA: Diagnosis not present

## 2020-02-10 DIAGNOSIS — G500 Trigeminal neuralgia: Secondary | ICD-10-CM | POA: Diagnosis not present

## 2020-02-10 DIAGNOSIS — F419 Anxiety disorder, unspecified: Secondary | ICD-10-CM | POA: Diagnosis not present

## 2020-02-10 DIAGNOSIS — L299 Pruritus, unspecified: Secondary | ICD-10-CM | POA: Diagnosis not present

## 2020-02-10 DIAGNOSIS — G4700 Insomnia, unspecified: Secondary | ICD-10-CM | POA: Diagnosis not present

## 2020-02-10 DIAGNOSIS — R82998 Other abnormal findings in urine: Secondary | ICD-10-CM | POA: Diagnosis not present

## 2020-02-10 DIAGNOSIS — M542 Cervicalgia: Secondary | ICD-10-CM | POA: Diagnosis not present

## 2020-02-10 DIAGNOSIS — E782 Mixed hyperlipidemia: Secondary | ICD-10-CM | POA: Diagnosis not present

## 2020-02-10 DIAGNOSIS — I10 Essential (primary) hypertension: Secondary | ICD-10-CM | POA: Diagnosis not present

## 2020-04-10 ENCOUNTER — Ambulatory Visit: Payer: Medicare Other | Attending: Internal Medicine

## 2020-04-10 DIAGNOSIS — Z23 Encounter for immunization: Secondary | ICD-10-CM

## 2020-04-10 NOTE — Progress Notes (Signed)
   Covid-19 Vaccination Clinic  Name:  Kimberly Baxter    MRN: [id number] DOB: [birth year]  04/10/2020  [name] was observed post Covid-19 immunization for 15 minutes without incident. She was provided with Vaccine Information Sheet and instruction to access the V-Safe system.   [name] was instructed to call 911 with any severe reactions post vaccine: Marland Kitchen Difficulty breathing  . Swelling of face and throat  . A fast heartbeat  . A bad rash all over body  . Dizziness and weakness   Immunizations Administered    Name Date Dose VIS Date Route   Pfizer COVID-19 Vaccine 04/10/2020 11:00 AM 0.3 mL 01/21/2020 Intramuscular   Manufacturer: [place]   Lot: [id number]   NDC: [address]-4

## 2020-04-13 DIAGNOSIS — I10 Essential (primary) hypertension: Secondary | ICD-10-CM | POA: Diagnosis not present

## 2020-04-13 DIAGNOSIS — K219 Gastro-esophageal reflux disease without esophagitis: Secondary | ICD-10-CM | POA: Diagnosis not present

## 2020-04-13 DIAGNOSIS — F419 Anxiety disorder, unspecified: Secondary | ICD-10-CM | POA: Diagnosis not present

## 2020-04-13 DIAGNOSIS — M542 Cervicalgia: Secondary | ICD-10-CM | POA: Diagnosis not present

## 2020-04-13 DIAGNOSIS — G4700 Insomnia, unspecified: Secondary | ICD-10-CM | POA: Diagnosis not present

## 2020-04-13 DIAGNOSIS — G500 Trigeminal neuralgia: Secondary | ICD-10-CM | POA: Diagnosis not present

## 2020-06-08 DIAGNOSIS — L821 Other seborrheic keratosis: Secondary | ICD-10-CM | POA: Diagnosis not present

## 2020-06-08 DIAGNOSIS — L308 Other specified dermatitis: Secondary | ICD-10-CM | POA: Diagnosis not present

## 2020-06-08 DIAGNOSIS — R21 Rash and other nonspecific skin eruption: Secondary | ICD-10-CM | POA: Diagnosis not present

## 2020-06-08 DIAGNOSIS — L820 Inflamed seborrheic keratosis: Secondary | ICD-10-CM | POA: Diagnosis not present

## 2020-06-08 DIAGNOSIS — L309 Dermatitis, unspecified: Secondary | ICD-10-CM | POA: Diagnosis not present

## 2020-07-28 DIAGNOSIS — H5203 Hypermetropia, bilateral: Secondary | ICD-10-CM | POA: Diagnosis not present

## 2020-07-28 DIAGNOSIS — H2513 Age-related nuclear cataract, bilateral: Secondary | ICD-10-CM | POA: Diagnosis not present

## 2020-09-16 DIAGNOSIS — L259 Unspecified contact dermatitis, unspecified cause: Secondary | ICD-10-CM | POA: Diagnosis not present

## 2020-09-16 DIAGNOSIS — R590 Localized enlarged lymph nodes: Secondary | ICD-10-CM | POA: Diagnosis not present

## 2020-09-21 ENCOUNTER — Other Ambulatory Visit: Payer: Self-pay | Admitting: Family Medicine

## 2020-09-21 DIAGNOSIS — R590 Localized enlarged lymph nodes: Secondary | ICD-10-CM

## 2020-09-29 DIAGNOSIS — H7292 Unspecified perforation of tympanic membrane, left ear: Secondary | ICD-10-CM | POA: Diagnosis not present

## 2020-09-29 DIAGNOSIS — H9212 Otorrhea, left ear: Secondary | ICD-10-CM | POA: Diagnosis not present

## 2020-10-05 NOTE — Progress Notes (Signed)
New Patient Note  RE: Kimberly Baxter MRN: [id number] DOB: 1951-01-28 Date of Office Visit: 10/06/2020  Consult requested by: Leanna Battles, MD Primary care provider: Leanna Battles, MD  Chief Complaint: Rash (Been going on sporadically for past 2 years and gets worse over time. States it's in her scalp and her back, arm, and legs.)  History of Present Illness: I had the pleasure of seeing [name] for initial evaluation at the [facility] on 10/07/2020. She is a 70 y.o. female, who is referred here by Leanna Battles, MD for the evaluation of pruritus.  Rash started about 2 years ago. Mainly occurs on her face, scalp. Describes them as itchy, dry, red, raised sometimes. Individual rashes lasts about a few days. Usually has a flare once a week or so. No ecchymosis upon resolution. Associated symptoms include: palpitations at times. Suspected triggers are unknown but possible the rash on her torso/face got worse after eating steaks. Denies any fevers, chills, changes in medications, foods, personal care products or recent infections. She has tried the following therapies: benadryl prn, betamethasone, fluticasone cream with some benefit. Systemic steroids yes. Currently on no daily antihistamines.  Previous work up includes: followed by dermatology - scalp biopsy showed dermatitis. No prior patch testing but she is scheduled for patch testing at [facility] in September.  Previous history of rash/hives: as a child had eczema. Patient is up to date with the following cancer screening tests: physical exam, colonoscopy, mammogram, pap smears. No personal history of cancer.   She tried to change her hair products and hair dye and not sure if it's helping. She gets her hair dyed still and gets her nails done as well.  Currently using ecco's laundry detergent, cerave cream.  No recent tick bites.  Assessment and Plan: Regenia is a 70 y.o. female with: Rash and other nonspecific skin  eruption 2-year history of rash mainly on her scalp and face area.  Describes it as itchy, dry, red and raised sometimes.  Usually has a flare once a week or so which lasts a few days at a time.  Tried multiple topical steroid creams with some benefit.  Dermatology did skin biopsy which showed dermatitis.  No prior patch testing.  Eczema as a child.  Concerned about allergic triggers.  Patient does get her hair dyed and nails done. Questionable rash worsening after eating red meat.  Today's skin testing showed: Negative to indoor/outdoor allergens and select foods.  Based on distribution of the rash concern for contact dermatitis.  See below for proper skin care - make sure you are using all fragrance free and dye free products. Start to avoid all red meat - beef, pork, lamb. Get bloodwork to rule out other etiologies.  Recommend patch testing next - TRUE patch test.  Start zyrtec 10mg  daily for itching.  May use topical creams as needed as per dermatology.  Chronic rhinitis Rhinorrhea in the spring and fall.  No prior allergy testing. Today's skin prick testing showed: Negative to indoor/outdoor allergens. Monitor symptoms.  Return in about 2 months (around 12/07/2020).  No orders of the defined types were placed in this encounter.  Lab Orders  CBC with Differential/Platelet  Alpha-Gal Panel  ANA w/Reflex  Comprehensive metabolic panel  Tryptase  Thyroid Cascade Profile  Chronic Urticaria    Other allergy screening: Asthma: no Rhino conjunctivitis: yes Rhinorrhea in the spring and fall.  Food allergy:  as a child she was allergic to milk but now  tolerates with no issues. Medication allergy: no Hymenoptera allergy: no History of recurrent infections suggestive of immunodeficency: no  Diagnostics: Skin Testing: Environmental allergy panel and select foods. Negative to indoor/outdoor allergens and select foods.  Results discussed with patient/family.  Airborne Adult Perc -  10/06/20 0929     Time Antigen Placed 8527    Allergen Manufacturer Lavella Hammock    Location Back    Number of Test 59    1. Control-Buffer 50% Glycerol Negative    2. Control-Histamine 1 mg/ml 2+    3. Albumin saline Negative    4. New Harmony Negative    5. [place] Negative    6. Johnson Negative    7. [place] Blue Negative    8. Meadow Fescue Negative    9. Perennial Rye Negative    10. Sweet Vernal Negative    11. Timothy Negative    12. Cocklebur Negative    13. Burweed Marshelder Negative    14. Ragweed, short Negative    15. Ragweed, Giant Negative    16. Plantain,  English Negative    17. Lamb's Quarters Negative    18. Sheep Sorrell Negative    19. Rough Pigweed Negative    20. Marsh Elder, Rough Negative    21. Mugwort, Common Negative    22. Ash mix Negative    23. Birch mix Negative    24. Beech American Negative    25. Box, Elder Negative    26. Cedar, red Negative    27. Cottonwood, [place] Negative    28. Elm mix Negative    29. Hickory Negative    30. Maple mix Negative    31. Oak, [place] mix Negative    32. Pecan Pollen Negative    33. Pine mix Negative    34. Sycamore Eastern Negative    35. [place], Black Pollen Negative    36. Alternaria alternata Negative    37. Cladosporium Herbarum Negative    38. Aspergillus mix Negative    39. Penicillium mix Negative    40. Bipolaris sorokiniana (Helminthosporium) Negative    41. Drechslera spicifera (Curvularia) Negative    42. Mucor plumbeus Negative    43. Fusarium moniliforme Negative    44. Aureobasidium pullulans (pullulara) Negative    45. Rhizopus oryzae Negative    46. Botrytis cinera Negative    47. Epicoccum nigrum Negative    48. Phoma betae Negative    49. Candida Albicans Negative    50. Trichophyton mentagrophytes Negative    51. Mite, D Farinae  5,000 AU/ml Negative    52. Mite, D Pteronyssinus  5,000 AU/ml Negative    53. Cat Hair 10,000 BAU/ml Negative    54.  Dog Epithelia Negative    55.  Mixed Feathers Negative    56. Horse Epithelia Negative    57. Cockroach, German Negative    58. Mouse Negative    59. Tobacco Leaf Negative             Food Perc - 10/06/20 0929       Test Information   Time Antigen Placed 7824    Allergen Manufacturer Lavella Hammock    Location Back    Number of allergen test 10      Food   1. Peanut Negative    2. Soybean food Negative    3. Wheat, whole Negative    4. Sesame Negative    5. Milk, cow Negative    6. Egg White, chicken Negative  7. Casein Negative    8. Shellfish mix Negative    9. Fish mix Negative    10. Cashew Negative             Food Adult Perc - 10/06/20 0900     Time Antigen Placed 5427    Allergen Manufacturer Lavella Hammock    Location Back    Number of allergen test 3    37. Pork Negative    40. Beef Negative    41. Lamb Negative             Past Medical History: Patient Active Problem List   Diagnosis Date Noted   Rash and other nonspecific skin eruption 10/07/2020   Chronic rhinitis 10/07/2020   Arthritis    CIN I (cervical intraepithelial neoplasia I)    Elevated cholesterol    Trigeminal neuralgia    Umbilical hernia    Past Medical History:  Diagnosis Date   Arthritis    CIN I (cervical intraepithelial neoplasia I)    Elevated cholesterol    Hypertension    Lumbar herniated disc 2013   Trigeminal neuralgia    Umbilical hernia    Past Surgical History: Past Surgical History:  Procedure Laterality Date   CERVICAL BIOPSY  W/ LOOP ELECTRODE EXCISION  0623,7628   X 2   COLPOSCOPY     TYMPANOSTOMY TUBE PLACEMENT     UMBILICAL HERNIA REPAIR     Medication List:  Current Outpatient Medications  Medication Sig Dispense Refill   betamethasone dipropionate 0.05 % cream Apply topically.     betamethasone dipropionate 0.05 % lotion betamethasone dipropionate 0.05 % lotion  APPLY TO SCALP DAILY FOR 2 WEEKS     carbamazepine (TEGRETOL) 200 MG tablet Take 1.5 tablets by mouth 2 (two) times  daily.     Cholecalciferol (VITAMIN D3) 25 MCG (1000 UT) CAPS Take 1 capsule by mouth daily.     escitalopram (LEXAPRO) 10 MG tablet Take by mouth.     fluticasone (CUTIVATE) 0.05 % cream Apply topically 2 (two) times daily.     fluticasone (FLONASE) 50 MCG/ACT nasal spray Place 1 spray into both nostrils as needed.     hydrOXYzine (ATARAX/VISTARIL) 25 MG tablet Take by mouth.     Multiple Vitamin (MULTIVITAMIN) tablet Take 1 tablet by mouth daily.     ofloxacin (FLOXIN) 0.3 % OTIC solution SMARTSIG:5 Drop(s) Left Ear Twice Daily     Probiotic Product (PROBIOTIC-10 ULTIMATE PO) Take 1 tablet by mouth daily.     rosuvastatin (CRESTOR) 10 MG tablet Take 1 tablet by mouth daily.     No current facility-administered medications for this visit.   Allergies: No Known Allergies Social History: Social History   Socioeconomic History   Marital status: Married    Spouse name: Not on file   Number of children: Not on file   Years of education: Not on file   Highest education level: Not on file  Occupational History   Not on file  Tobacco Use   Smoking status: Never   Smokeless tobacco: Never  Vaping Use   Vaping Use: Never used  Substance and Sexual Activity   Alcohol use: Yes    Alcohol/week: 6.0 standard drinks    Types: 3 Shots of liquor, 3 Standard drinks or equivalent per week    Comment: socially alcohol 2-3 a week   Drug use: No   Sexual activity: Yes    Partners: Male    Birth control/protection: Post-menopausal    Comment:  only occ.  Other Topics Concern   Not on file  Social History Narrative   Not on file   Social Determinants of Health   Financial Resource Strain: Not on file  Food Insecurity: Not on file  Transportation Needs: Not on file  Physical Activity: Not on file  Stress: Not on file  Social Connections: Not on file   Lives in a 70 years old. Smoking: denies Occupation: retired Therapist, sports HistoryFreight forwarder in the house: no Charity fundraiser  in the family room: no Carpet in the bedroom: no Heating: gas Cooling: central Pet: yes 2 cats  x 7 yrs, 9 yrs  Family History: Family History  Problem Relation Age of Onset   Colon cancer Father    Pancreatic cancer Father    Stomach cancer Father    Diabetes Maternal Grandmother    Heart disease Maternal Grandmother    Hypertension Maternal Grandfather    Heart disease Maternal Grandfather    Cancer Mother        Carcinoid syndrome   Problem                               Relation Asthma                                   Grandson.  Eczema                                No  Food allergy                          No  Allergic rhino conjunctivitis     Grandson  Review of Systems  Constitutional:  Negative for appetite change, chills, fever and unexpected weight change.  HENT:  Negative for congestion and rhinorrhea.   Eyes:  Negative for itching.  Respiratory:  Negative for cough, chest tightness, shortness of breath and wheezing.   Cardiovascular:  Negative for chest pain.  Gastrointestinal:  Negative for abdominal pain.  Genitourinary:  Negative for difficulty urinating.  Skin:  Positive for rash.  Allergic/Immunologic: Negative for environmental allergies.  Neurological:  Negative for headaches.   Objective: BP 122/72   Pulse 73   Temp 98.3 F (36.8 C) (Temporal)   Resp 16   Ht 5\' 6"  (1.676 m)   Wt 135 lb 9.6 oz (61.5 kg)   LMP 04/03/2002 (Approximate)   SpO2 99%   BMI 21.89 kg/m  Body mass index is 21.89 kg/m. Physical Exam Vitals and nursing note reviewed.  Constitutional:      Appearance: Normal appearance. She is well-developed.  HENT:     Head: Normocephalic and atraumatic.     Right Ear: Tympanic membrane and external ear normal.     Left Ear: External ear normal.     Ears:     Comments: Perforated TM    Nose: Nose normal.     Mouth/Throat:     Mouth: Mucous membranes are moist.     Pharynx: Oropharynx is clear.  Eyes:     Conjunctiva/sclera:  Conjunctivae normal.  Cardiovascular:     Rate and Rhythm: Normal rate and regular rhythm.     Heart sounds: Normal heart sounds. No murmur heard.   No friction rub. No gallop.  Pulmonary:  Effort: Pulmonary effort is normal.     Breath sounds: Normal breath sounds. No wheezing, rhonchi or rales.  Abdominal:     Palpations: Abdomen is soft.  Musculoskeletal:     Cervical back: Neck supple.  Skin:    General: Skin is warm and dry.     Findings: Rash present.     Comments: Dry, flaky scalp. Dry skin throughout.   Neurological:     Mental Status: She is alert and oriented to person, place, and time.  Psychiatric:        Behavior: Behavior normal.  The plan was reviewed with the patient/family, and all questions/concerned were addressed.  It was my pleasure to see Judie today and participate in her care. Please feel free to contact me with any questions or concerns.  Sincerely,  Rexene Alberts, DO Allergy & Immunology  [facility] office: [place] office: [phone]

## 2020-10-06 ENCOUNTER — Ambulatory Visit
Admission: RE | Admit: 2020-10-06 | Discharge: 2020-10-06 | Disposition: A | Discharge status: Home / Self Care | Payer: Medicare Other | Source: Ambulatory Visit | Attending: Family Medicine | Admitting: Family Medicine

## 2020-10-06 ENCOUNTER — Ambulatory Visit (INDEPENDENT_AMBULATORY_CARE_PROVIDER_SITE_OTHER): Payer: Medicare Other | Admitting: Allergy

## 2020-10-06 ENCOUNTER — Encounter: Payer: Self-pay | Admitting: Allergy

## 2020-10-06 ENCOUNTER — Other Ambulatory Visit: Payer: Self-pay

## 2020-10-06 VITALS — BP 122/72 | HR 73 | Temp 98.3°F | Resp 16 | Ht 66.0 in | Wt 135.6 lb

## 2020-10-06 DIAGNOSIS — R21 Rash and other nonspecific skin eruption: Secondary | ICD-10-CM

## 2020-10-06 DIAGNOSIS — J310 Chronic rhinitis: Secondary | ICD-10-CM

## 2020-10-06 DIAGNOSIS — J341 Cyst and mucocele of nose and nasal sinus: Secondary | ICD-10-CM | POA: Diagnosis not present

## 2020-10-06 DIAGNOSIS — R590 Localized enlarged lymph nodes: Secondary | ICD-10-CM

## 2020-10-06 DIAGNOSIS — M5030 Other cervical disc degeneration, unspecified cervical region: Secondary | ICD-10-CM | POA: Diagnosis not present

## 2020-10-06 DIAGNOSIS — T781XXD Other adverse food reactions, not elsewhere classified, subsequent encounter: Secondary | ICD-10-CM

## 2020-10-06 DIAGNOSIS — I6529 Occlusion and stenosis of unspecified carotid artery: Secondary | ICD-10-CM | POA: Diagnosis not present

## 2020-10-06 MED ORDER — IOPAMIDOL (ISOVUE-300) INJECTION 61%
75.0000 mL | Freq: Once | INTRAVENOUS | Status: AC | PRN
  Administered 2020-10-06: 75 mL via INTRAVENOUS

## 2020-10-06 NOTE — Patient Instructions (Addendum)
Today's skin testing showed: Negative to indoor/outdoor allergens and select foods.   Rash: Concern for contact dermatitis.  See below for proper skin care - make sure you are using all fragrance free and dye free products. Start to avoid all red meat - beef, pork, lamb.  Get bloodwork:  We are ordering labs, so please allow 1-2 weeks for the res ults to come back. With the newly implemented Cures Act, the labs might be visible to you at the same time that they become visible to me. However, I will not address the results until all of the results are back, so please be patient.  In the meantime, continue recommendations in your patient instructions, including avoidance measures (if applicable), until you hear from me. Recommend patch testing next. Patches are best placed on Monday with return to office on Wednesday and Friday of same week for readings.  Patches once placed should not get wet.  You do not have to stop any medications for patch testing but should not be on oral prednisone. You can schedule a patch testing visit when convenient for your schedule.   Start zyrtec 10mg  daily for itching.  May use topical creams as needed as per your dermatologist.   Follow up in 2 months or sooner if needed.   True Test looks for the following sensitivities:       Skin care recommendations  Bath time: Always use lukewarm water. AVOID very hot or cold water. Keep bathing time to 5-10 minutes. Do NOT use bubble bath. Use a mild soap and use just enough to wash the dirty areas. Do NOT scrub skin vigorously.  After bathing, pat dry your skin with a towel. Do NOT rub or scrub the skin.  Moisturizers and prescriptions:  ALWAYS apply moisturizers immediately after bathing (within 3 minutes). This helps to lock-in moisture. Use the moisturizer several times a day over the whole body. Good summer moisturizers include: Aveeno, CeraVe, Cetaphil. Good winter moisturizers include: Aquaphor,  Vaseline, Cerave, Cetaphil, Eucerin, Vanicream. When using moisturizers along with medications, the moisturizer should be applied about one hour after applying the medication to prevent diluting effect of the medication or moisturize around where you applied the medications. When not using medications, the moisturizer can be continued twice daily as maintenance.  Laundry and clothing: Avoid laundry products with added color or perfumes. Use unscented hypo-allergenic laundry products such as Tide free, Cheer free & gentle, and All free and clear.  If the skin still seems dry or sensitive, you can try double-rinsing the clothes. Avoid tight or scratchy clothing such as wool. Do not use fabric softeners or dyer sheets.

## 2020-10-07 DIAGNOSIS — R21 Rash and other nonspecific skin eruption: Secondary | ICD-10-CM | POA: Insufficient documentation

## 2020-10-07 DIAGNOSIS — J310 Chronic rhinitis: Secondary | ICD-10-CM | POA: Insufficient documentation

## 2020-10-07 NOTE — Assessment & Plan Note (Signed)
Rhinorrhea in the spring and fall.  No prior allergy testing.  Today's skin prick testing showed: Negative to indoor/outdoor allergens.  Monitor symptoms.

## 2020-10-07 NOTE — Assessment & Plan Note (Signed)
2-year history of rash mainly on her scalp and face area.  Describes it as itchy, dry, red and raised sometimes.  Usually has a flare once a week or so which lasts a few days at a time.  Tried multiple topical steroid creams with some benefit.  Dermatology did skin biopsy which showed dermatitis.  No prior patch testing.  Eczema as a child.  Concerned about allergic triggers.  Patient does get her hair dyed and nails done. Questionable rash worsening after eating red meat.   Today's skin testing showed: Negative to indoor/outdoor allergens and select foods.  . Based on distribution of the rash concern for contact dermatitis.  . See below for proper skin care - make sure you are using all fragrance free and dye free products. . Start to avoid all red meat - beef, pork, lamb. . Get bloodwork to rule out other etiologies.  . Recommend patch testing next - TRUE patch test.  . Start zyrtec 10mg  daily for itching.  . May use topical creams as needed as per dermatology.

## 2020-10-08 ENCOUNTER — Other Ambulatory Visit: Payer: Self-pay | Admitting: Internal Medicine

## 2020-10-08 DIAGNOSIS — R590 Localized enlarged lymph nodes: Secondary | ICD-10-CM | POA: Diagnosis not present

## 2020-10-08 DIAGNOSIS — D496 Neoplasm of unspecified behavior of brain: Secondary | ICD-10-CM

## 2020-10-08 DIAGNOSIS — H7202 Central perforation of tympanic membrane, left ear: Secondary | ICD-10-CM | POA: Diagnosis not present

## 2020-10-11 ENCOUNTER — Other Ambulatory Visit: Payer: Self-pay | Admitting: Otolaryngology

## 2020-10-11 ENCOUNTER — Ambulatory Visit
Admission: RE | Admit: 2020-10-11 | Discharge: 2020-10-11 | Disposition: A | Discharge status: Home / Self Care | Payer: Medicare Other | Source: Ambulatory Visit | Attending: Internal Medicine | Admitting: Internal Medicine

## 2020-10-11 ENCOUNTER — Other Ambulatory Visit: Payer: Medicare Other

## 2020-10-11 ENCOUNTER — Other Ambulatory Visit: Payer: Self-pay

## 2020-10-11 DIAGNOSIS — D496 Neoplasm of unspecified behavior of brain: Secondary | ICD-10-CM

## 2020-10-11 DIAGNOSIS — C310 Malignant neoplasm of maxillary sinus: Secondary | ICD-10-CM | POA: Diagnosis not present

## 2020-10-11 DIAGNOSIS — R599 Enlarged lymph nodes, unspecified: Secondary | ICD-10-CM

## 2020-10-11 DIAGNOSIS — D329 Benign neoplasm of meninges, unspecified: Secondary | ICD-10-CM | POA: Diagnosis not present

## 2020-10-11 MED ORDER — GADOBENATE DIMEGLUMINE 529 MG/ML IV SOLN
12.0000 mL | Freq: Once | INTRAVENOUS | Status: AC | PRN
  Administered 2020-10-11: 12 mL via INTRAVENOUS

## 2020-10-15 LAB — ALPHA-GAL PANEL
Allergen Lamb IgE: <0.1 kU/L
Beef IgE: <0.1 kU/L
IgE (Immunoglobulin E), Serum: 10 IU/mL (ref 6–495)
O215-IgE Alpha-Gal: <0.1 kU/L
Pork IgE: <0.1 kU/L

## 2020-10-15 LAB — CBC WITH DIFFERENTIAL/PLATELET
Basophils Absolute: 0.1 10*3/uL (ref 0.0–0.2)
Basos: 2 %
EOS (ABSOLUTE): 0.1 10*3/uL (ref 0.0–0.4)
Eos: 3 %
Hematocrit: 41.3 % (ref 34.0–46.6)
Hemoglobin: 13.5 g/dL (ref 11.1–15.9)
Immature Grans (Abs): 0 10*3/uL (ref 0.0–0.1)
Immature Granulocytes: 0 %
Lymphocytes Absolute: 0.6 10*3/uL — ABNORMAL LOW (ref 0.7–3.1)
Lymphs: 19 %
MCH: 30.8 pg (ref 26.6–33.0)
MCHC: 32.7 g/dL (ref 31.5–35.7)
MCV: 94 fL (ref 79–97)
Monocytes Absolute: 0.4 10*3/uL (ref 0.1–0.9)
Monocytes: 11 %
Neutrophils Absolute: 2.2 10*3/uL (ref 1.4–7.0)
Neutrophils: 65 %
Platelets: 260 10*3/uL (ref 150–450)
RBC: 4.39 x10E6/uL (ref 3.77–5.28)
RDW: 12.5 % (ref 11.7–15.4)
WBC: 3.3 10*3/uL — ABNORMAL LOW (ref 3.4–10.8)

## 2020-10-15 LAB — COMPREHENSIVE METABOLIC PANEL
ALT: 21 IU/L (ref 0–32)
AST: 31 IU/L (ref 0–40)
Albumin/Globulin Ratio: 2.6 — ABNORMAL HIGH (ref 1.2–2.2)
Albumin: 5.1 g/dL — ABNORMAL HIGH (ref 3.7–4.7)
Alkaline Phosphatase: 92 IU/L (ref 44–121)
BUN/Creatinine Ratio: 21 (ref 12–28)
BUN: 14 mg/dL (ref 8–27)
Bilirubin Total: 0.3 mg/dL (ref 0.0–1.2)
CO2: 25 mmol/L (ref 20–29)
Calcium: 9.5 mg/dL (ref 8.7–10.3)
Chloride: 97 mmol/L (ref 96–106)
Creatinine, Ser: 0.68 mg/dL (ref 0.57–1.00)
Globulin, Total: 2 g/dL (ref 1.5–4.5)
Glucose: 96 mg/dL (ref 65–99)
Potassium: 4.4 mmol/L (ref 3.5–5.2)
Sodium: 134 mmol/L (ref 134–144)
Total Protein: 7.1 g/dL (ref 6.0–8.5)
eGFR: 93 mL/min/{1.73_m2} (ref >59)

## 2020-10-15 LAB — TRYPTASE: Tryptase: 3 ug/L (ref 2.2–13.2)

## 2020-10-15 LAB — ANA W/REFLEX: Anti Nuclear Antibody (ANA): NEGATIVE

## 2020-10-15 LAB — THYROID CASCADE PROFILE: TSH: 1.03 u[IU]/mL (ref 0.450–4.500)

## 2020-10-15 LAB — CHRONIC URTICARIA: cu index: <2.9 (ref <10)

## 2020-10-21 ENCOUNTER — Other Ambulatory Visit: Payer: Self-pay

## 2020-10-21 ENCOUNTER — Ambulatory Visit
Admission: RE | Admit: 2020-10-21 | Discharge: 2020-10-21 | Disposition: A | Discharge status: Home / Self Care | Payer: Medicare Other | Source: Ambulatory Visit | Attending: Otolaryngology | Admitting: Otolaryngology

## 2020-10-21 DIAGNOSIS — R599 Enlarged lymph nodes, unspecified: Secondary | ICD-10-CM

## 2020-10-21 DIAGNOSIS — E041 Nontoxic single thyroid nodule: Secondary | ICD-10-CM | POA: Diagnosis not present

## 2020-10-21 DIAGNOSIS — R590 Localized enlarged lymph nodes: Secondary | ICD-10-CM | POA: Diagnosis not present

## 2020-10-25 ENCOUNTER — Other Ambulatory Visit: Payer: Self-pay

## 2020-10-25 ENCOUNTER — Ambulatory Visit (INDEPENDENT_AMBULATORY_CARE_PROVIDER_SITE_OTHER): Payer: Medicare Other | Admitting: Allergy

## 2020-10-25 ENCOUNTER — Encounter: Payer: Self-pay | Admitting: Allergy

## 2020-10-25 VITALS — BP 110/64 | HR 65 | Temp 97.6°F | Resp 18

## 2020-10-25 DIAGNOSIS — L239 Allergic contact dermatitis, unspecified cause: Secondary | ICD-10-CM | POA: Diagnosis not present

## 2020-10-25 NOTE — Assessment & Plan Note (Signed)
TRUE patches placed today.

## 2020-10-25 NOTE — Patient Instructions (Signed)
Patches placed today. Please avoid strenuous physical activities and do not get the patches on the back wet. No showering until final patch reading done. Okay to take antihistamines for itching but avoid placing any creams on the back where the patches are. We will remove the patches on Wednesday and will do our initial read. Then you will come back on Friday for a final read. 

## 2020-10-25 NOTE — Progress Notes (Signed)
   Follow Up Note  RE: Kimberly Baxter MRN: [id number] DOB: 1951-01-30 Date of Office Visit: 10/25/2020  Referring provider: Leanna Battles, MD Primary care provider: Leanna Battles, MD  History of Present Illness: I had the pleasure of seeing Kimberly Baxter for a follow up visit at the [facility] [place] on 10/25/2020. She is a 70 y.o. female, who is being followed for dermatitis. Today she is here for patch test placement, given suspected history of contact dermatitis.   No issues with reintroduction of red meat.  Taking zyrtec '10mg'$  daily with unknown benefit.  Diagnostics: TRUE Test patches placed.   Assessment and Plan: Kimberly Baxter is a 70 y.o. female with: Allergic contact dermatitis TRUE patches placed today.  The patient was instructed regarding proper care of the patches for the next 48 hours. Do not get patches wet - avoid showering until the next visit. Do not engage in vigorous physical activity. Patient will follow up in 48 hours and 96 hours for patch readings.  It was my pleasure to see Kimberly Baxter today and participate in her care. Please feel free to contact me with any questions or concerns.  Sincerely,  Rexene Alberts, DO Allergy & Immunology  [facility] office: [phone] [facility] office: [place] office: [phone]

## 2020-10-27 ENCOUNTER — Ambulatory Visit: Payer: Medicare Other | Admitting: Family Medicine

## 2020-10-27 ENCOUNTER — Encounter: Payer: Self-pay | Admitting: Family Medicine

## 2020-10-27 ENCOUNTER — Other Ambulatory Visit: Payer: Self-pay

## 2020-10-27 VITALS — BP 96/52 | HR 83 | Temp 98.2°F | Resp 18 | Ht 66.0 in | Wt 135.0 lb

## 2020-10-27 DIAGNOSIS — L235 Allergic contact dermatitis due to other chemical products: Secondary | ICD-10-CM

## 2020-10-27 NOTE — Patient Instructions (Addendum)
Diagnostics:   TRUE TEST 48-hour hour reading:  positive reaction to #1 (Nickel Sulfate), possible reaction to #5 (Caine mix), positive reaction to #17 (Cl+Me-Isothiazolinone), and positive reaction to #20 (p-Phenylene-diamine)   Plan:   Allergic contact dermatitis - The patient has been provided detailed information regarding the substances she is sensitive to, as well as products containing the substances.   - Meticulous avoidance of these substances is recommended.  - If avoidance is not possible, the use of barrier creams or lotions is recommended. - If symptoms persist or progress despite meticulous avoidance of the substances as listed above, a dermatology referral may be warranted.  Call the clinic if this treatment plan is not working well for you  Follow up in 2 days or sooner if needed.

## 2020-10-27 NOTE — Progress Notes (Signed)
    Follow-up Note  RE: Kimberly Baxter MRN: [id number] DOB: 1950-07-18 Date of Office Visit: 10/27/2020  Primary care provider: Leanna Battles, MD Referring provider: Leanna Battles, MD   Kimberly Baxter returns to the office today for the initial patch test interpretation, given suspected history of contact dermatitis.    Diagnostics:   TRUE TEST 48-hour hour reading: positive reaction to #1 (Nickel Sulfate), possible reaction to #5 (Caine mix), positive reaction to #17 (Cl+Me-Isothiazolinone), and positive reaction to #20 (p-Phenylene-diamine)   Plan:   Allergic contact dermatitis - The patient has been provided detailed information regarding the substances she is sensitive to, as well as products containing the substances.   - Meticulous avoidance of these substances is recommended.  - If avoidance is not possible, the use of barrier creams or lotions is recommended. - If symptoms persist or progress despite meticulous avoidance of the substances as listed above, a dermatology referral may be warranted.  Thank you for the opportunity to care for this patient.  Please do not hesitate to contact me with questions.  Gareth Morgan, FNP [facility]

## 2020-10-29 ENCOUNTER — Ambulatory Visit: Payer: Medicare Other | Admitting: Family

## 2020-10-29 ENCOUNTER — Other Ambulatory Visit: Payer: Self-pay

## 2020-10-29 ENCOUNTER — Encounter: Payer: Self-pay | Admitting: Family

## 2020-10-29 VITALS — Temp 98.2°F

## 2020-10-29 DIAGNOSIS — L235 Allergic contact dermatitis due to other chemical products: Secondary | ICD-10-CM

## 2020-10-29 NOTE — Progress Notes (Signed)
Kimberly Baxter returns to the office today for the final patch test interpretation, given suspected history of contact dermatitis.    Diagnostics:   TRUE TEST 96-hour hour reading:strong positive reaction( ++) to nickel sulfate,weak positive reaction (+) to fragrance mix, strong positive reaction to Cl+ Me-Isothiazolinone   Plan:   Allergic contact dermatitis - The patient has been provided detailed information regarding the substances she is sensitive to, as well as products containing the substances.   - Meticulous avoidance of these substances is recommended.  - If avoidance is not possible, the use of barrier creams or lotions is recommended. - If symptoms persist or progress despite meticulous avoidance of nickel sulfate, fragrance mix, Cl+ Me-Isothiazolinone, Dermatology Referral may be warranted.  Keep already scheduled follow up appointment with Dr. Maudie Mercury on 01/24/2021 @ 11:15 am.  Thank you,  Kimberly Charon, FNP [facility] [place] [facility]

## 2020-11-25 ENCOUNTER — Other Ambulatory Visit: Payer: Self-pay | Admitting: Obstetrics and Gynecology

## 2020-11-25 DIAGNOSIS — Z1231 Encounter for screening mammogram for malignant neoplasm of breast: Secondary | ICD-10-CM

## 2020-11-30 ENCOUNTER — Ambulatory Visit: Payer: Medicare Other | Admitting: Allergy and Immunology

## 2020-11-30 DIAGNOSIS — U071 COVID-19: Secondary | ICD-10-CM | POA: Diagnosis not present

## 2020-12-22 ENCOUNTER — Ambulatory Visit: Payer: Medicare Other | Admitting: Allergy

## 2020-12-29 ENCOUNTER — Other Ambulatory Visit: Payer: Self-pay

## 2020-12-29 ENCOUNTER — Ambulatory Visit
Admission: RE | Admit: 2020-12-29 | Discharge: 2020-12-29 | Disposition: A | Discharge status: Home / Self Care | Payer: Medicare Other | Source: Ambulatory Visit | Attending: Obstetrics and Gynecology | Admitting: Obstetrics and Gynecology

## 2020-12-29 DIAGNOSIS — Z1231 Encounter for screening mammogram for malignant neoplasm of breast: Secondary | ICD-10-CM

## 2021-01-04 ENCOUNTER — Ambulatory Visit: Payer: Medicare Other | Admitting: Obstetrics and Gynecology

## 2021-01-06 DIAGNOSIS — L820 Inflamed seborrheic keratosis: Secondary | ICD-10-CM | POA: Diagnosis not present

## 2021-01-06 DIAGNOSIS — L821 Other seborrheic keratosis: Secondary | ICD-10-CM | POA: Diagnosis not present

## 2021-01-06 DIAGNOSIS — L710 Perioral dermatitis: Secondary | ICD-10-CM | POA: Diagnosis not present

## 2021-01-07 DIAGNOSIS — Z23 Encounter for immunization: Secondary | ICD-10-CM | POA: Diagnosis not present

## 2021-01-24 ENCOUNTER — Ambulatory Visit: Payer: Medicare Other | Admitting: Allergy

## 2021-01-24 DIAGNOSIS — R590 Localized enlarged lymph nodes: Secondary | ICD-10-CM | POA: Diagnosis not present

## 2021-01-24 NOTE — Progress Notes (Deleted)
Follow Up Note  RE: Kimberly Baxter MRN: [id number] DOB: 31-Mar-1951 Date of Office Visit: 01/24/2021  Referring provider: Leanna Battles, MD Primary care provider: Leanna Battles, MD  Chief Complaint: No chief complaint on file.  History of Present Illness: I had the pleasure of seeing Kimberly Baxter for a follow up visit at the [facility] on 01/24/2021. She is a 70 y.o. female, who is being followed for contact dermatitis, rash, and rhinitis. Her previous allergy office visit was on 10/29/2020 with Kimberly Baxter. Today is a regular follow up visit.  Rash and other nonspecific skin eruption 2-year history of rash mainly on her scalp and face area.  Describes it as itchy, dry, red and raised sometimes.  Usually has a flare once a week or so which lasts a few days at a time.  Tried multiple topical steroid creams with some benefit.  Dermatology did skin biopsy which showed dermatitis.  No prior patch testing.  Eczema as a child.  Concerned about allergic triggers.  Patient does get her hair dyed and nails done. Questionable rash worsening after eating red meat.  Today's skin testing showed: Negative to indoor/outdoor allergens and select foods.  Based on distribution of the rash concern for contact dermatitis.  See below for proper skin care - make sure you are using all fragrance free and dye free products. Start to avoid all red meat - beef, pork, lamb. Get bloodwork to rule out other etiologies.  Recommend patch testing next - TRUE patch test.  Start zyrtec 10mg  daily for itching.  May use topical creams as needed as per dermatology.   Chronic rhinitis Rhinorrhea in the spring and fall.  No prior allergy testing. Today's skin prick testing showed: Negative to indoor/outdoor allergens. Monitor symptoms.  Allergic contact dermatitis - The patient has been provided detailed information regarding the substances she is sensitive to, as well as products containing  the substances.   - Meticulous avoidance of these substances is recommended.  - If avoidance is not possible, the use of barrier creams or lotions is recommended. - If symptoms persist or progress despite meticulous avoidance of nickel sulfate, fragrance mix, Cl+ Me-Isothiazolinone, Dermatology Referral may be warranted.   Keep already scheduled follow up appointment with Kimberly Baxter on 01/24/2021 @ 11:15 am.  Assessment and Plan: Kimberly Baxter is a 70 y.o. female with: No problem-specific Assessment & Plan notes found for this encounter.  No follow-ups on file.  No orders of the defined types were placed in this encounter.  Lab Orders  No laboratory test(s) ordered today    Diagnostics: Spirometry:  Tracings reviewed. Her effort: {Blank single:19197::"Good reproducible efforts.","It was hard to get consistent efforts and there is a question as to whether this reflects a maximal maneuver.","Poor effort, data can not be interpreted."} FVC: ***L FEV1: ***L, ***% predicted FEV1/FVC ratio: ***% Interpretation: {Blank single:19197::"Spirometry consistent with mild obstructive disease","Spirometry consistent with moderate obstructive disease","Spirometry consistent with severe obstructive disease","Spirometry consistent with possible restrictive disease","Spirometry consistent with mixed obstructive and restrictive disease","Spirometry uninterpretable due to technique","Spirometry consistent with normal pattern","No overt abnormalities noted given today's efforts"}.  Please see scanned spirometry results for details.  Skin Testing: {Blank single:19197::"Select foods","Environmental allergy panel","Environmental allergy panel and select foods","Food allergy panel","None","Deferred due to recent antihistamines use"}. *** Results discussed with patient/family.   Medication List:  Current Outpatient Medications  Medication Sig Dispense Refill   betamethasone dipropionate 0.05 % cream Apply topically.      betamethasone dipropionate 0.05 %  lotion betamethasone dipropionate 0.05 % lotion  APPLY TO SCALP DAILY FOR 2 WEEKS     carbamazepine (TEGRETOL) 200 MG tablet Take 1.5 tablets by mouth 2 (two) times daily.     Cholecalciferol (VITAMIN D3) 25 MCG (1000 UT) CAPS Take 1 capsule by mouth daily.     escitalopram (LEXAPRO) 10 MG tablet Take by mouth.     fluticasone (CUTIVATE) 0.05 % cream Apply topically 2 (two) times daily.     fluticasone (FLONASE) 50 MCG/ACT nasal spray Place 1 spray into both nostrils as needed.     hydrOXYzine (ATARAX/VISTARIL) 25 MG tablet Take by mouth.     Multiple Vitamin (MULTIVITAMIN) tablet Take 1 tablet by mouth daily.     Probiotic Product (PROBIOTIC-10 ULTIMATE PO) Take 1 tablet by mouth daily.     rosuvastatin (CRESTOR) 10 MG tablet Take 1 tablet by mouth daily.     No current facility-administered medications for this visit.   Allergies: No Known Allergies I reviewed her past medical history, social history, family history, and environmental history and no significant changes have been reported from her previous visit.  Review of Systems  Constitutional:  Negative for appetite change, chills, fever and unexpected weight change.  HENT:  Negative for congestion and rhinorrhea.   Eyes:  Negative for itching.  Respiratory:  Negative for cough, chest tightness, shortness of breath and wheezing.   Cardiovascular:  Negative for chest pain.  Gastrointestinal:  Negative for abdominal pain.  Genitourinary:  Negative for difficulty urinating.  Skin:  Positive for rash.  Allergic/Immunologic: Negative for environmental allergies.  Neurological:  Negative for headaches.   Objective: LMP 04/03/2002 (Approximate)  There is no height or weight on file to calculate BMI. Physical Exam Vitals and nursing note reviewed.  Constitutional:      Appearance: Normal appearance. She is well-developed.  HENT:     Head: Normocephalic and atraumatic.     Right Ear: Tympanic  membrane and external ear normal.     Left Ear: External ear normal.     Ears:     Comments: Perforated TM    Nose: Nose normal.     Mouth/Throat:     Mouth: Mucous membranes are moist.     Pharynx: Oropharynx is clear.  Eyes:     Conjunctiva/sclera: Conjunctivae normal.  Cardiovascular:     Rate and Rhythm: Normal rate and regular rhythm.     Heart sounds: Normal heart sounds. No murmur heard.   No friction rub. No gallop.  Pulmonary:     Effort: Pulmonary effort is normal.     Breath sounds: Normal breath sounds. No wheezing, rhonchi or rales.  Abdominal:     Palpations: Abdomen is soft.  Musculoskeletal:     Cervical back: Neck supple.  Skin:    General: Skin is warm and dry.     Findings: Rash present.     Comments: Dry, flaky scalp. Dry skin throughout.   Neurological:     Mental Status: She is alert and oriented to person, place, and time.  Psychiatric:        Behavior: Behavior normal.   Previous notes and tests were reviewed. The plan was reviewed with the patient/family, and all questions/concerned were addressed.  It was my pleasure to see Bevan today and participate in her care. Please feel free to contact me with any questions or concerns.  Sincerely,  Rexene Alberts, DO Allergy & Immunology  [facility] office: [place]  [place] office: [phone]

## 2021-03-07 DIAGNOSIS — R0981 Nasal congestion: Secondary | ICD-10-CM | POA: Diagnosis not present

## 2021-03-07 DIAGNOSIS — R5383 Other fatigue: Secondary | ICD-10-CM | POA: Diagnosis not present

## 2021-03-07 DIAGNOSIS — R051 Acute cough: Secondary | ICD-10-CM | POA: Diagnosis not present

## 2021-03-07 DIAGNOSIS — Z1152 Encounter for screening for COVID-19: Secondary | ICD-10-CM | POA: Diagnosis not present

## 2021-03-07 DIAGNOSIS — R490 Dysphonia: Secondary | ICD-10-CM | POA: Diagnosis not present

## 2021-03-08 DIAGNOSIS — E782 Mixed hyperlipidemia: Secondary | ICD-10-CM | POA: Diagnosis not present

## 2021-03-08 DIAGNOSIS — I10 Essential (primary) hypertension: Secondary | ICD-10-CM | POA: Diagnosis not present

## 2021-03-15 DIAGNOSIS — I10 Essential (primary) hypertension: Secondary | ICD-10-CM | POA: Diagnosis not present

## 2021-03-15 DIAGNOSIS — R42 Dizziness and giddiness: Secondary | ICD-10-CM | POA: Diagnosis not present

## 2021-03-15 DIAGNOSIS — E871 Hypo-osmolality and hyponatremia: Secondary | ICD-10-CM | POA: Diagnosis not present

## 2021-03-15 DIAGNOSIS — R82998 Other abnormal findings in urine: Secondary | ICD-10-CM | POA: Diagnosis not present

## 2021-03-15 DIAGNOSIS — R231 Pallor: Secondary | ICD-10-CM | POA: Diagnosis not present

## 2021-03-15 DIAGNOSIS — R61 Generalized hyperhidrosis: Secondary | ICD-10-CM | POA: Diagnosis not present

## 2021-03-15 DIAGNOSIS — R55 Syncope and collapse: Secondary | ICD-10-CM | POA: Diagnosis not present

## 2021-03-15 DIAGNOSIS — Z0000 Encounter for general adult medical examination without abnormal findings: Secondary | ICD-10-CM | POA: Diagnosis not present

## 2021-03-15 DIAGNOSIS — M545 Low back pain, unspecified: Secondary | ICD-10-CM | POA: Diagnosis not present

## 2021-03-15 DIAGNOSIS — E782 Mixed hyperlipidemia: Secondary | ICD-10-CM | POA: Diagnosis not present

## 2021-06-15 DIAGNOSIS — R5383 Other fatigue: Secondary | ICD-10-CM | POA: Diagnosis not present

## 2021-06-15 DIAGNOSIS — J029 Acute pharyngitis, unspecified: Secondary | ICD-10-CM | POA: Diagnosis not present

## 2021-06-15 DIAGNOSIS — J0110 Acute frontal sinusitis, unspecified: Secondary | ICD-10-CM | POA: Diagnosis not present

## 2021-06-15 DIAGNOSIS — Z1152 Encounter for screening for COVID-19: Secondary | ICD-10-CM | POA: Diagnosis not present

## 2021-06-15 DIAGNOSIS — R0981 Nasal congestion: Secondary | ICD-10-CM | POA: Diagnosis not present

## 2021-06-17 DIAGNOSIS — H10413 Chronic giant papillary conjunctivitis, bilateral: Secondary | ICD-10-CM | POA: Diagnosis not present

## 2021-06-17 DIAGNOSIS — H02834 Dermatochalasis of left upper eyelid: Secondary | ICD-10-CM | POA: Diagnosis not present

## 2021-06-17 DIAGNOSIS — H2513 Age-related nuclear cataract, bilateral: Secondary | ICD-10-CM | POA: Diagnosis not present

## 2021-06-17 DIAGNOSIS — H02831 Dermatochalasis of right upper eyelid: Secondary | ICD-10-CM | POA: Diagnosis not present

## 2021-10-29 IMAGING — MG MM DIGITAL SCREENING BILAT W/ TOMO AND CAD
8 series · 9 of 24 positions shown · non-contrast
Comparison: Previous exam(s).

CLINICAL DATA: Screening.

EXAM:
DIGITAL SCREENING BILATERAL MAMMOGRAM WITH TOMOSYNTHESIS AND CAD
TECHNIQUE: Bilateral screening digital craniocaudal and mediolateral oblique
mammograms were obtained. Bilateral screening digital breast
tomosynthesis was performed. The images were evaluated with
computer-aided detection.

[R MLO synth-2D]
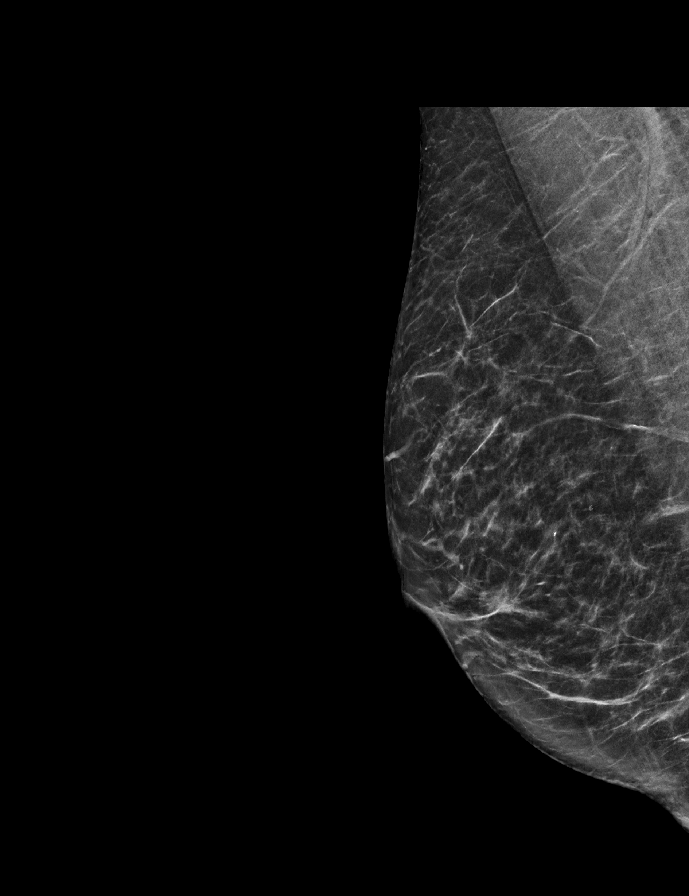

[L CC synth-2D]
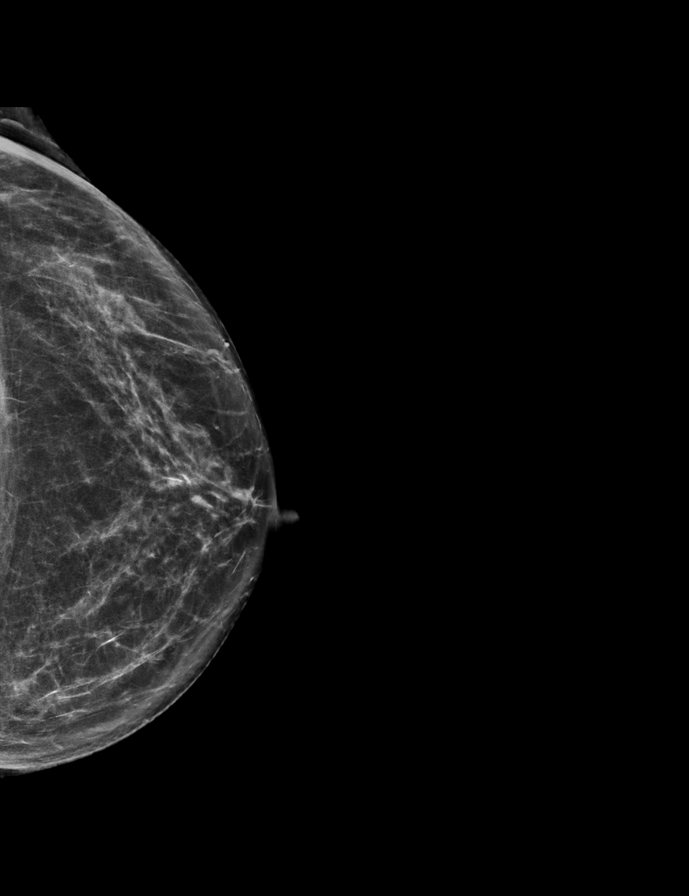

[L MLO synth-2D]
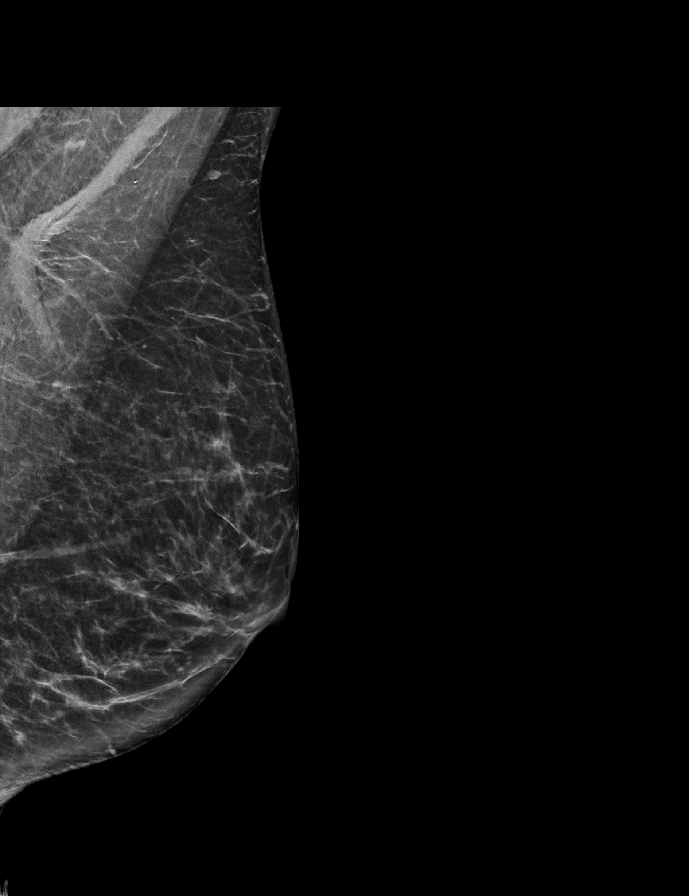

[R CC synth-2D]
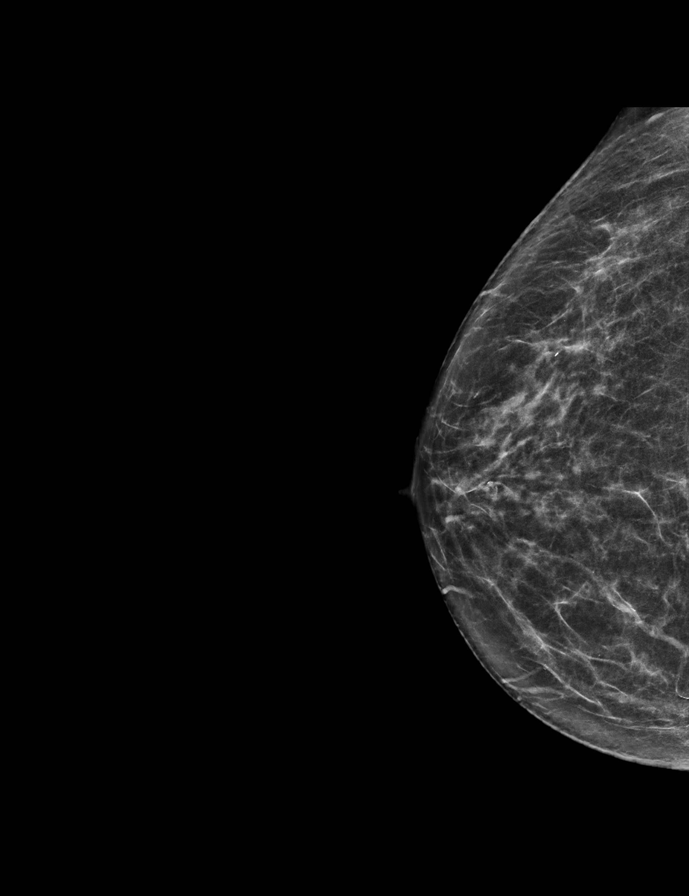

[R CC tomo · 2 of 64 frames shown]
[frame 21/64]
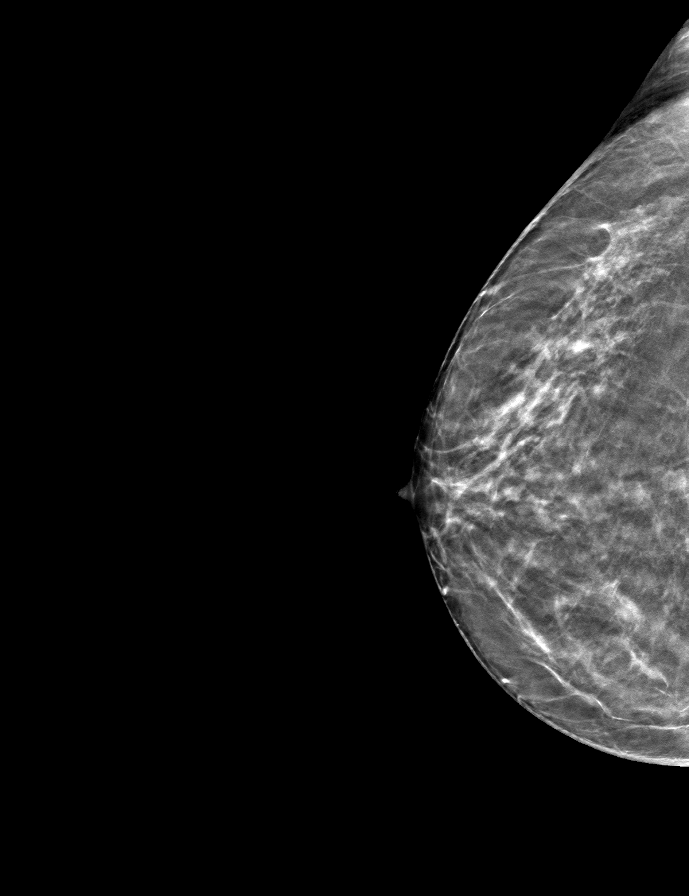
[frame 33/64]
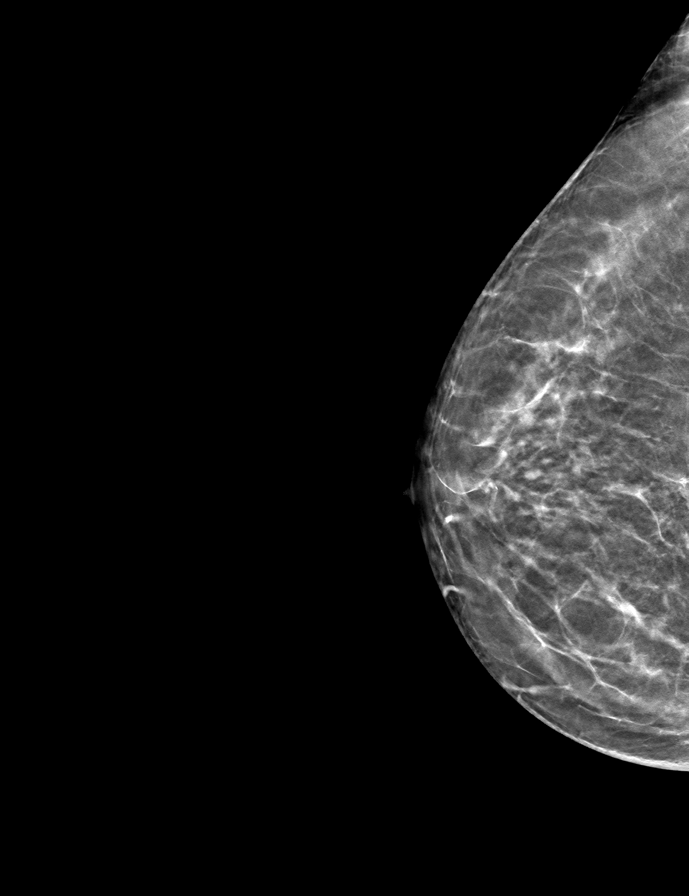

[R MLO tomo · tomo slice 29/58.0]
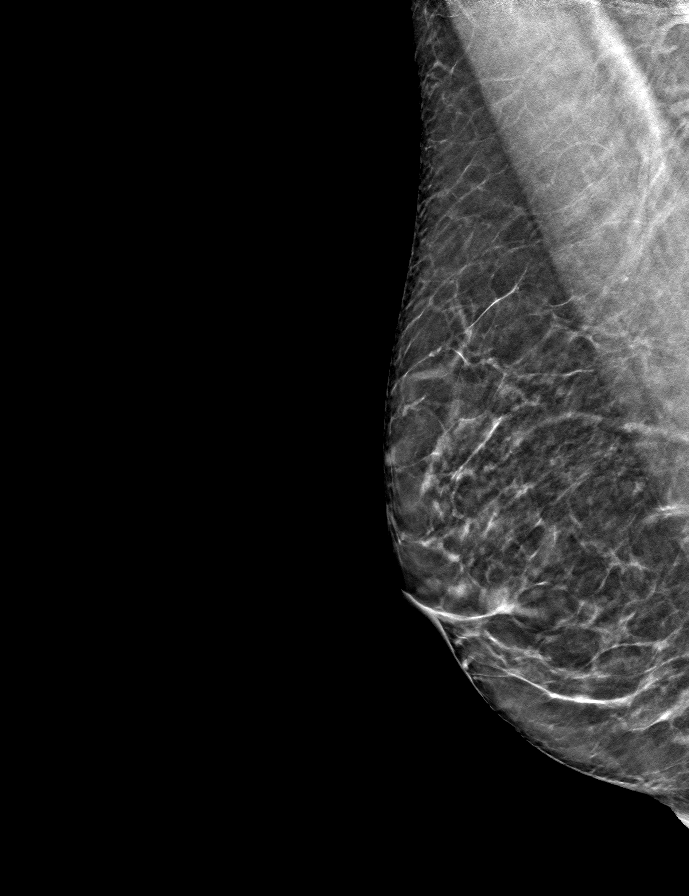

[L MLO tomo · tomo slice 32/63.0]
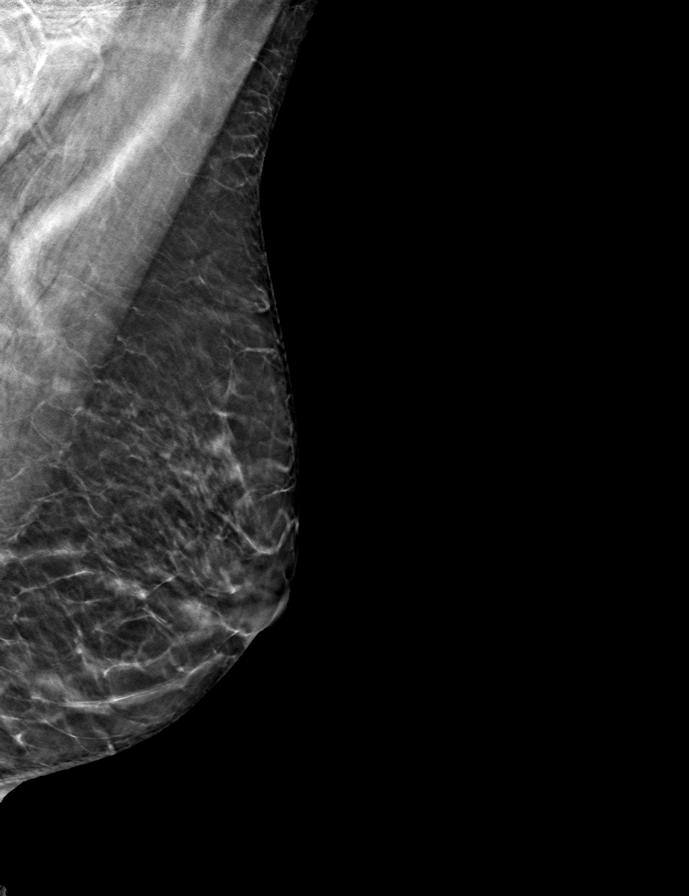

[L CC tomo · tomo slice 33/66.0]
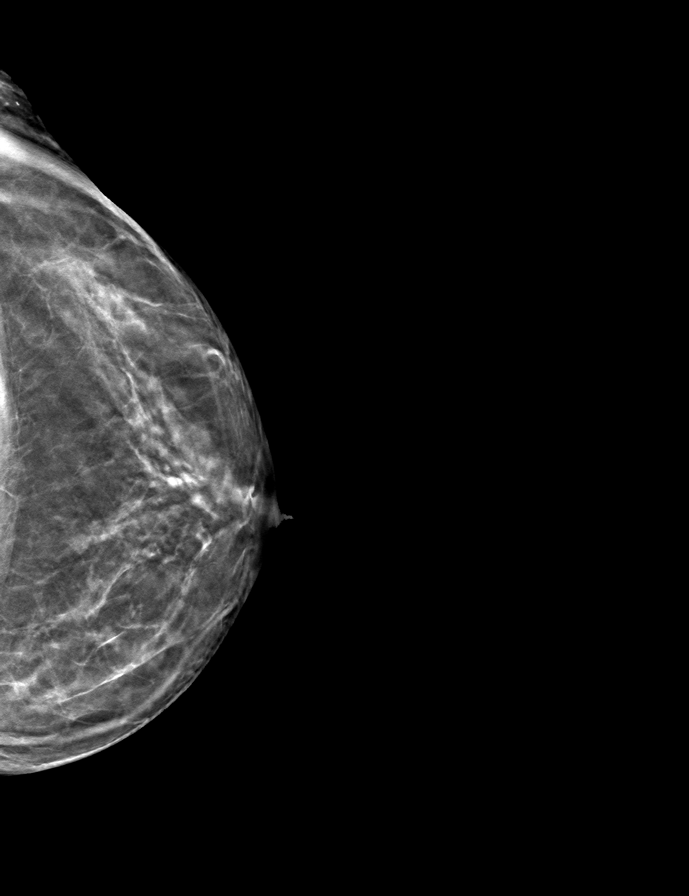

[9 of 24 positions shown; findings below may reference images not displayed]

ACR Breast Density Category b: There are scattered areas of
fibroglandular density.
FINDINGS: There are no findings suspicious for malignancy.
IMPRESSION: No mammographic evidence of malignancy. A result letter of this
screening mammogram will be mailed directly to the patient.

RECOMMENDATION:
Screening mammogram in one year. (Code:51-O-LD2)

BI-RADS CATEGORY  1: Negative.

## 2021-12-04 ENCOUNTER — Emergency Department (HOSPITAL_BASED_OUTPATIENT_CLINIC_OR_DEPARTMENT_OTHER): Payer: Medicare Other | Admitting: Radiology

## 2021-12-04 ENCOUNTER — Encounter (HOSPITAL_BASED_OUTPATIENT_CLINIC_OR_DEPARTMENT_OTHER): Payer: Self-pay | Admitting: Emergency Medicine

## 2021-12-04 ENCOUNTER — Emergency Department (HOSPITAL_BASED_OUTPATIENT_CLINIC_OR_DEPARTMENT_OTHER)
Admission: EM | Admit: 2021-12-04 | Discharge: 2021-12-04 | Disposition: A | Discharge status: Home / Self Care | Payer: Medicare Other | Attending: Emergency Medicine | Admitting: Emergency Medicine

## 2021-12-04 DIAGNOSIS — S8262XA Displaced fracture of lateral malleolus of left fibula, initial encounter for closed fracture: Secondary | ICD-10-CM | POA: Diagnosis not present

## 2021-12-04 DIAGNOSIS — Z79899 Other long term (current) drug therapy: Secondary | ICD-10-CM | POA: Insufficient documentation

## 2021-12-04 DIAGNOSIS — S8992XA Unspecified injury of left lower leg, initial encounter: Secondary | ICD-10-CM | POA: Diagnosis present

## 2021-12-04 DIAGNOSIS — S89302A Unspecified physeal fracture of lower end of left fibula, initial encounter for closed fracture: Secondary | ICD-10-CM | POA: Insufficient documentation

## 2021-12-04 DIAGNOSIS — X501XXA Overexertion from prolonged static or awkward postures, initial encounter: Secondary | ICD-10-CM | POA: Diagnosis not present

## 2021-12-04 DIAGNOSIS — S82832A Other fracture of upper and lower end of left fibula, initial encounter for closed fracture: Secondary | ICD-10-CM

## 2021-12-04 NOTE — ED Provider Notes (Signed)
[facility] EMERGENCY DEPT Provider Note   CSN: [id number] Arrival date & time: 12/04/21  1151     History {Add pertinent medical, surgical, social history, OB history to HPI:1} Chief Complaint  Patient presents with   Kimberly Baxter is a 71 y.o. female.  She is here for evaluation of left ankle pain.  She said she was walking outside on the driveway when she twisted her ankle.  Since then has had significant pain to her left lateral ankle.  Denies other injuries or complaints.  She does not use a walker or a cane.  No numbness or weakness.  No other injuries or complaints.  The history is provided by the patient.  Fall This is a new problem. The current episode started 12 to 24 hours ago. The problem has not changed since onset.Pertinent negatives include no chest pain, no abdominal pain and no headaches. The symptoms are aggravated by bending, twisting and walking. Nothing relieves the symptoms. She has tried rest and a cold compress for the symptoms. The treatment provided mild relief.       Home Medications Prior to Admission medications   Medication Sig Start Date End Date Taking? Authorizing Provider  betamethasone dipropionate 0.05 % cream Apply topically. 12/29/19   [provider]  betamethasone dipropionate 0.05 % lotion betamethasone dipropionate 0.05 % lotion  APPLY TO SCALP DAILY FOR 2 WEEKS    [provider]  carbamazepine (TEGRETOL) 200 MG tablet Take 1.5 tablets by mouth 2 (two) times daily. 09/22/14   [provider]  Cholecalciferol (VITAMIN D3) 25 MCG (1000 UT) CAPS Take 1 capsule by mouth daily.    [provider]  escitalopram (LEXAPRO) 10 MG tablet Take by mouth. 08/09/20   [provider]  fluticasone (CUTIVATE) 0.05 % cream Apply topically 2 (two) times daily. 08/10/20   [provider]  fluticasone (FLONASE) 50 MCG/ACT nasal spray Place 1 spray into both nostrils as needed. 10/11/15    [provider]  hydrOXYzine (ATARAX/VISTARIL) 25 MG tablet Take by mouth. 12/10/19   [provider]  Multiple Vitamin (MULTIVITAMIN) tablet Take 1 tablet by mouth daily.    [provider]  Probiotic Product (PROBIOTIC-10 ULTIMATE PO) Take 1 tablet by mouth daily. 11/10/18   [provider]  rosuvastatin (CRESTOR) 10 MG tablet Take 1 tablet by mouth daily. 09/04/18   [provider]      Allergies    Patient has no known allergies.    Review of Systems   Review of Systems  Constitutional:  Negative for fever.  Cardiovascular:  Negative for chest pain.  Gastrointestinal:  Negative for abdominal pain.  Musculoskeletal:  Positive for gait problem. Negative for back pain and neck pain.  Skin:  Negative for wound.  Neurological:  Negative for weakness, numbness and headaches.    Physical Exam Updated Vital Signs BP 129/76   Pulse 70   Temp 98 F (36.7 C) (Oral)   Resp 20   LMP 04/03/2002 (Approximate)   SpO2 100%  Physical Exam Vitals and nursing note reviewed.  Constitutional:      Appearance: Normal appearance. She is well-developed.  HENT:     Head: Normocephalic and atraumatic.  Eyes:     Conjunctiva/sclera: Conjunctivae normal.  Musculoskeletal:        General: Tenderness and signs of injury present.     Cervical back: Neck supple.     Comments: Lower extremity nontender hip and knee.  Ankle  moderate tenderness lateral side.  She has moderate swelling there.  She has minimal medial tenderness and no calcaneal or fifth metatarsal tenderness.  Distal pulses motor and sensation intact.  No open wounds.  Skin:    General: Skin is warm and dry.  Neurological:     General: No focal deficit present.     Mental Status: She is alert.     GCS: GCS eye subscore is 4. GCS verbal subscore is 5. GCS motor subscore is 6.     Sensory: No sensory deficit.     Motor: No weakness.     ED Results / Procedures / Treatments   Labs (all labs  ordered are listed, but only abnormal results are displayed) Labs Reviewed - No data to display  EKG None  Radiology DG Ankle Complete Left  Result Date: 12/04/2021 CLINICAL DATA:  Ankle swelling, pain EXAM: LEFT ANKLE COMPLETE - 3+ VIEW COMPARISON:  None Available. FINDINGS: There is a minimally displaced transverse lateral malleolar fracture below the syndesmosis. This the ankle mortise appears otherwise intact. There is ankle soft tissue swelling most prominent laterally. IMPRESSION: Minimally displaced lateral malleolar fracture. Electronically Signed   By: Maurine Simmering M.D.   On: 12/04/2021 13:14    Procedures Procedures  {Document cardiac monitor, telemetry assessment procedure when appropriate:1}  Medications Ordered in ED Medications - No data to display  ED Course/ Medical Decision Making/ A&P Clinical Course as of 12/04/21 1533  Sun Dec 04, 2021  1525 Patient had x-ray of her left ankle which shows a minimally displaced transverse fracture of the lateral malleolus below the level of the tibial plafond.  Mortise appears intact. [MB]    Clinical Course User Index [MB] Hayden Rasmussen, MD                           Medical Decision Making  ***  {Document critical care time when appropriate:1} {Document review of labs and clinical decision tools ie heart score, Chads2Vasc2 etc:1}  {Document your independent review of radiology images, and any outside records:1} {Document your discussion with family members, caretakers, and with consultants:1} {Document social determinants of health affecting pt's care:1} {Document your decision making why or why not admission, treatments were needed:1} Final Clinical Impression(s) / ED Diagnoses Final diagnoses:  None    Rx / DC Orders ED Discharge Orders     None

## 2021-12-04 NOTE — Discharge Instructions (Signed)
Seen in the emergency department for left ankle pain after a fall.  You had a fracture of the distal fibula of your ankle.  Orthopedics is recommending a splint, crutches, nonweightbearing and follow-up with Dr. Kathaleen Bury this week.  You can use ice to the affected area and Tylenol as needed for pain.  Return to the emergency department if any worsening or concerning symptoms

## 2021-12-04 NOTE — ED Triage Notes (Signed)
Pt turned her ankle while walking in the driveway last night and left ankle is swollen/tender

## 2021-12-08 DIAGNOSIS — M25572 Pain in left ankle and joints of left foot: Secondary | ICD-10-CM | POA: Diagnosis not present

## 2021-12-08 DIAGNOSIS — S8265XA Nondisplaced fracture of lateral malleolus of left fibula, initial encounter for closed fracture: Secondary | ICD-10-CM | POA: Diagnosis not present

## 2021-12-30 ENCOUNTER — Other Ambulatory Visit: Payer: Self-pay | Admitting: Obstetrics and Gynecology

## 2021-12-30 DIAGNOSIS — Z1231 Encounter for screening mammogram for malignant neoplasm of breast: Secondary | ICD-10-CM

## 2022-01-17 ENCOUNTER — Ambulatory Visit
Admission: RE | Admit: 2022-01-17 | Discharge: 2022-01-17 | Disposition: A | Discharge status: Home / Self Care | Payer: Medicare Other | Source: Ambulatory Visit | Attending: Obstetrics and Gynecology | Admitting: Obstetrics and Gynecology

## 2022-01-17 DIAGNOSIS — Z1231 Encounter for screening mammogram for malignant neoplasm of breast: Secondary | ICD-10-CM

## 2022-01-17 DIAGNOSIS — L821 Other seborrheic keratosis: Secondary | ICD-10-CM | POA: Diagnosis not present

## 2022-01-17 DIAGNOSIS — D2261 Melanocytic nevi of right upper limb, including shoulder: Secondary | ICD-10-CM | POA: Diagnosis not present

## 2022-01-17 DIAGNOSIS — L570 Actinic keratosis: Secondary | ICD-10-CM | POA: Diagnosis not present

## 2022-01-19 DIAGNOSIS — S8265XA Nondisplaced fracture of lateral malleolus of left fibula, initial encounter for closed fracture: Secondary | ICD-10-CM | POA: Diagnosis not present

## 2022-02-14 DIAGNOSIS — Z23 Encounter for immunization: Secondary | ICD-10-CM | POA: Diagnosis not present

## 2022-03-02 DIAGNOSIS — S8265XA Nondisplaced fracture of lateral malleolus of left fibula, initial encounter for closed fracture: Secondary | ICD-10-CM | POA: Diagnosis not present

## 2022-03-08 DIAGNOSIS — R0981 Nasal congestion: Secondary | ICD-10-CM | POA: Diagnosis not present

## 2022-03-08 DIAGNOSIS — J0110 Acute frontal sinusitis, unspecified: Secondary | ICD-10-CM | POA: Diagnosis not present

## 2022-03-08 DIAGNOSIS — R5383 Other fatigue: Secondary | ICD-10-CM | POA: Diagnosis not present

## 2022-03-08 DIAGNOSIS — R059 Cough, unspecified: Secondary | ICD-10-CM | POA: Diagnosis not present

## 2022-03-08 DIAGNOSIS — Z1152 Encounter for screening for COVID-19: Secondary | ICD-10-CM | POA: Diagnosis not present

## 2022-03-08 DIAGNOSIS — R490 Dysphonia: Secondary | ICD-10-CM | POA: Diagnosis not present

## 2022-05-02 DIAGNOSIS — I10 Essential (primary) hypertension: Secondary | ICD-10-CM | POA: Diagnosis not present

## 2022-05-02 DIAGNOSIS — E782 Mixed hyperlipidemia: Secondary | ICD-10-CM | POA: Diagnosis not present

## 2022-05-02 DIAGNOSIS — R7989 Other specified abnormal findings of blood chemistry: Secondary | ICD-10-CM | POA: Diagnosis not present

## 2022-05-02 DIAGNOSIS — K219 Gastro-esophageal reflux disease without esophagitis: Secondary | ICD-10-CM | POA: Diagnosis not present

## 2022-05-09 DIAGNOSIS — E871 Hypo-osmolality and hyponatremia: Secondary | ICD-10-CM | POA: Diagnosis not present

## 2022-05-09 DIAGNOSIS — K219 Gastro-esophageal reflux disease without esophagitis: Secondary | ICD-10-CM | POA: Diagnosis not present

## 2022-05-09 DIAGNOSIS — Z1339 Encounter for screening examination for other mental health and behavioral disorders: Secondary | ICD-10-CM | POA: Diagnosis not present

## 2022-05-09 DIAGNOSIS — I10 Essential (primary) hypertension: Secondary | ICD-10-CM | POA: Diagnosis not present

## 2022-05-09 DIAGNOSIS — M542 Cervicalgia: Secondary | ICD-10-CM | POA: Diagnosis not present

## 2022-05-09 DIAGNOSIS — Z1331 Encounter for screening for depression: Secondary | ICD-10-CM | POA: Diagnosis not present

## 2022-05-09 DIAGNOSIS — Z1212 Encounter for screening for malignant neoplasm of rectum: Secondary | ICD-10-CM | POA: Diagnosis not present

## 2022-05-09 DIAGNOSIS — G4700 Insomnia, unspecified: Secondary | ICD-10-CM | POA: Diagnosis not present

## 2022-05-09 DIAGNOSIS — E782 Mixed hyperlipidemia: Secondary | ICD-10-CM | POA: Diagnosis not present

## 2022-05-09 DIAGNOSIS — Z0000 Encounter for general adult medical examination without abnormal findings: Secondary | ICD-10-CM | POA: Diagnosis not present

## 2022-05-09 DIAGNOSIS — D72819 Decreased white blood cell count, unspecified: Secondary | ICD-10-CM | POA: Diagnosis not present

## 2022-05-09 DIAGNOSIS — G500 Trigeminal neuralgia: Secondary | ICD-10-CM | POA: Diagnosis not present

## 2022-05-09 DIAGNOSIS — R82998 Other abnormal findings in urine: Secondary | ICD-10-CM | POA: Diagnosis not present

## 2022-05-09 DIAGNOSIS — K429 Umbilical hernia without obstruction or gangrene: Secondary | ICD-10-CM | POA: Diagnosis not present

## 2022-05-10 ENCOUNTER — Other Ambulatory Visit: Payer: Self-pay | Admitting: Internal Medicine

## 2022-05-10 DIAGNOSIS — K219 Gastro-esophageal reflux disease without esophagitis: Secondary | ICD-10-CM

## 2022-05-10 DIAGNOSIS — E782 Mixed hyperlipidemia: Secondary | ICD-10-CM

## 2022-05-12 DIAGNOSIS — H9202 Otalgia, left ear: Secondary | ICD-10-CM | POA: Diagnosis not present

## 2022-05-12 DIAGNOSIS — Z189 Retained foreign body fragments, unspecified material: Secondary | ICD-10-CM | POA: Diagnosis not present

## 2022-05-29 ENCOUNTER — Ambulatory Visit
Admission: RE | Admit: 2022-05-29 | Discharge: 2022-05-29 | Disposition: A | Discharge status: Home / Self Care | Payer: Medicare Other | Source: Ambulatory Visit | Attending: Internal Medicine | Admitting: Internal Medicine

## 2022-05-29 DIAGNOSIS — K449 Diaphragmatic hernia without obstruction or gangrene: Secondary | ICD-10-CM | POA: Diagnosis not present

## 2022-05-29 DIAGNOSIS — K219 Gastro-esophageal reflux disease without esophagitis: Secondary | ICD-10-CM

## 2022-05-31 DIAGNOSIS — K4090 Unilateral inguinal hernia, without obstruction or gangrene, not specified as recurrent: Secondary | ICD-10-CM | POA: Diagnosis not present

## 2022-05-31 DIAGNOSIS — K439 Ventral hernia without obstruction or gangrene: Secondary | ICD-10-CM | POA: Diagnosis not present

## 2022-06-01 DIAGNOSIS — S8265XA Nondisplaced fracture of lateral malleolus of left fibula, initial encounter for closed fracture: Secondary | ICD-10-CM | POA: Diagnosis not present

## 2022-06-27 ENCOUNTER — Ambulatory Visit
Admission: RE | Admit: 2022-06-27 | Discharge: 2022-06-27 | Disposition: A | Discharge status: Home / Self Care | Payer: Medicare Other | Source: Ambulatory Visit | Attending: Internal Medicine | Admitting: Internal Medicine

## 2022-06-27 DIAGNOSIS — I700 Atherosclerosis of aorta: Secondary | ICD-10-CM | POA: Diagnosis not present

## 2022-06-27 DIAGNOSIS — E782 Mixed hyperlipidemia: Secondary | ICD-10-CM

## 2022-07-05 DIAGNOSIS — H9212 Otorrhea, left ear: Secondary | ICD-10-CM | POA: Diagnosis not present

## 2022-11-08 DIAGNOSIS — K4090 Unilateral inguinal hernia, without obstruction or gangrene, not specified as recurrent: Secondary | ICD-10-CM | POA: Diagnosis not present

## 2022-11-08 DIAGNOSIS — K439 Ventral hernia without obstruction or gangrene: Secondary | ICD-10-CM | POA: Diagnosis not present

## 2022-11-20 DIAGNOSIS — M25531 Pain in right wrist: Secondary | ICD-10-CM | POA: Diagnosis not present

## 2023-01-15 ENCOUNTER — Other Ambulatory Visit: Payer: Self-pay | Admitting: Obstetrics and Gynecology

## 2023-01-15 DIAGNOSIS — Z1231 Encounter for screening mammogram for malignant neoplasm of breast: Secondary | ICD-10-CM

## 2023-01-31 DIAGNOSIS — L814 Other melanin hyperpigmentation: Secondary | ICD-10-CM | POA: Diagnosis not present

## 2023-01-31 DIAGNOSIS — L821 Other seborrheic keratosis: Secondary | ICD-10-CM | POA: Diagnosis not present

## 2023-01-31 DIAGNOSIS — D225 Melanocytic nevi of trunk: Secondary | ICD-10-CM | POA: Diagnosis not present

## 2023-01-31 DIAGNOSIS — L570 Actinic keratosis: Secondary | ICD-10-CM | POA: Diagnosis not present

## 2023-01-31 DIAGNOSIS — L820 Inflamed seborrheic keratosis: Secondary | ICD-10-CM | POA: Diagnosis not present

## 2023-01-31 DIAGNOSIS — D2271 Melanocytic nevi of right lower limb, including hip: Secondary | ICD-10-CM | POA: Diagnosis not present

## 2023-01-31 DIAGNOSIS — D2272 Melanocytic nevi of left lower limb, including hip: Secondary | ICD-10-CM | POA: Diagnosis not present

## 2023-01-31 DIAGNOSIS — L720 Epidermal cyst: Secondary | ICD-10-CM | POA: Diagnosis not present

## 2023-02-12 DIAGNOSIS — K4090 Unilateral inguinal hernia, without obstruction or gangrene, not specified as recurrent: Secondary | ICD-10-CM | POA: Diagnosis not present

## 2023-02-12 DIAGNOSIS — K439 Ventral hernia without obstruction or gangrene: Secondary | ICD-10-CM | POA: Diagnosis not present

## 2023-02-13 ENCOUNTER — Other Ambulatory Visit: Payer: Self-pay | Admitting: Internal Medicine

## 2023-02-13 ENCOUNTER — Ambulatory Visit
Admission: RE | Admit: 2023-02-13 | Discharge: 2023-02-13 | Disposition: A | Discharge status: Home / Self Care | Payer: Medicare Other | Source: Ambulatory Visit | Attending: Obstetrics and Gynecology | Admitting: Obstetrics and Gynecology

## 2023-02-13 DIAGNOSIS — Z1231 Encounter for screening mammogram for malignant neoplasm of breast: Secondary | ICD-10-CM | POA: Diagnosis not present

## 2023-03-26 ENCOUNTER — Emergency Department (HOSPITAL_BASED_OUTPATIENT_CLINIC_OR_DEPARTMENT_OTHER)
Admission: EM | Admit: 2023-03-26 | Discharge: 2023-03-26 | Disposition: A | Discharge status: Home / Self Care | Payer: Medicare Other | Attending: Emergency Medicine | Admitting: Emergency Medicine

## 2023-03-26 ENCOUNTER — Encounter (HOSPITAL_BASED_OUTPATIENT_CLINIC_OR_DEPARTMENT_OTHER): Payer: Self-pay | Admitting: Emergency Medicine

## 2023-03-26 ENCOUNTER — Other Ambulatory Visit: Payer: Self-pay

## 2023-03-26 DIAGNOSIS — X58XXXA Exposure to other specified factors, initial encounter: Secondary | ICD-10-CM | POA: Insufficient documentation

## 2023-03-26 DIAGNOSIS — S6991XA Unspecified injury of right wrist, hand and finger(s), initial encounter: Secondary | ICD-10-CM | POA: Diagnosis present

## 2023-03-26 DIAGNOSIS — Z23 Encounter for immunization: Secondary | ICD-10-CM | POA: Insufficient documentation

## 2023-03-26 DIAGNOSIS — Z79899 Other long term (current) drug therapy: Secondary | ICD-10-CM | POA: Diagnosis not present

## 2023-03-26 DIAGNOSIS — S61011A Laceration without foreign body of right thumb without damage to nail, initial encounter: Secondary | ICD-10-CM | POA: Insufficient documentation

## 2023-03-26 MED ORDER — LIDOCAINE HCL (PF) 1 % IJ SOLN
5.0000 mL | Freq: Once | INTRAMUSCULAR | Status: AC
  Administered 2023-03-26: 5 mL
  Filled 2023-03-26: qty 5

## 2023-03-26 MED ORDER — BACITRACIN ZINC 500 UNIT/GM EX OINT
TOPICAL_OINTMENT | Freq: Once | CUTANEOUS | Status: AC
  Administered 2023-03-26: 1 via TOPICAL
  Filled 2023-03-26: qty 28.35

## 2023-03-26 MED ORDER — TETANUS-DIPHTH-ACELL PERTUSSIS 5-2.5-18.5 LF-MCG/0.5 IM SUSY
0.5000 mL | PREFILLED_SYRINGE | Freq: Once | INTRAMUSCULAR | Status: AC
  Administered 2023-03-26: 0.5 mL via INTRAMUSCULAR
  Filled 2023-03-26: qty 0.5

## 2023-03-26 NOTE — ED Triage Notes (Signed)
Pt presents to the ED via POV with complaints of finger laceration to the L thumb. Pt states she was using a mandolin to slice vegetables and accidentally sliced the inner portion of her thumb. Bleeding controlled with gauze and coban. Unsure of tetanus status. A&Ox4 at this time.

## 2023-03-26 NOTE — ED Provider Notes (Signed)
[facility] Provider Note   CSN: [id number] Arrival date & time: 03/26/23  2041     History  Chief Complaint  Patient presents with   Laceration    Kimberly Baxter is a 72 y.o. female.  Patient is a 72 year old female who presents with a finger laceration.  She was using a mandolin and sliced her right thumb.  She denies any other injuries.  She is not sure when her last tetanus shot was.  She thinks it was close to 10 years ago.       Home Medications Prior to Admission medications   Medication Sig Start Date End Date Taking? Authorizing Provider  betamethasone dipropionate 0.05 % cream Apply topically. 12/29/19   [provider]  betamethasone dipropionate 0.05 % lotion betamethasone dipropionate 0.05 % lotion  APPLY TO SCALP DAILY FOR 2 WEEKS    [provider]  carbamazepine (TEGRETOL) 200 MG tablet Take 1.5 tablets by mouth 2 (two) times daily. 09/22/14   [provider]  Cholecalciferol (VITAMIN D3) 25 MCG (1000 UT) CAPS Take 1 capsule by mouth daily.    [provider]  escitalopram (LEXAPRO) 10 MG tablet Take by mouth. 08/09/20   [provider]  fluticasone (CUTIVATE) 0.05 % cream Apply topically 2 (two) times daily. 08/10/20   [provider]  fluticasone (FLONASE) 50 MCG/ACT nasal spray Place 1 spray into both nostrils as needed. 10/11/15   [provider]  hydrOXYzine (ATARAX/VISTARIL) 25 MG tablet Take by mouth. 12/10/19   [provider]  Multiple Vitamin (MULTIVITAMIN) tablet Take 1 tablet by mouth daily.    [provider]  Probiotic Product (PROBIOTIC-10 ULTIMATE PO) Take 1 tablet by mouth daily. 11/10/18   [provider]  rosuvastatin (CRESTOR) 10 MG tablet Take 1 tablet by mouth daily. 09/04/18   [provider]      Allergies    Patient has no known allergies.    Review of Systems   Review of Systems  Constitutional:   Negative for fever.  Gastrointestinal:  Negative for nausea and vomiting.  Musculoskeletal:  Negative for arthralgias, back pain, joint swelling and neck pain.  Skin:  Positive for wound.  Neurological:  Negative for weakness, numbness and headaches.    Physical Exam Updated Vital Signs BP 123/62 (BP Location: Left Arm)   Pulse 64   Temp 97.7 F (36.5 C)   Resp 18   Ht 5\' 6"  (1.676 m)   Wt 61.2 kg   LMP 04/03/2002 (Approximate)   SpO2 94%   BMI 21.79 kg/m  Physical Exam Constitutional:      Appearance: She is well-developed.  HENT:     Head: Normocephalic and atraumatic.  Cardiovascular:     Rate and Rhythm: Normal rate.  Pulmonary:     Effort: Pulmonary effort is normal.  Musculoskeletal:     Cervical back: Normal range of motion and neck supple.     Comments: +2 cm linear laceration to the volar surface of the right thumb.  There is no bony tenderness.  She is able to flex and extend the finger without difficulty.  Capillary refills less than 2 distally.  The skin flap is a little bit dusky in color.  Skin:    General: Skin is warm and dry.  Neurological:     Mental Status: She is alert and oriented to person, place, and time.     ED Results / Procedures / Treatments  Labs (all labs ordered are listed, but only abnormal results are displayed) Labs Reviewed - No data to display  EKG None  Radiology No results found.  Procedures .Laceration Repair  Date/Time: 03/26/2023 10:56 PM  Performed by: Rolan Bucco, MD Authorized by: Rolan Bucco, MD   Consent:    Consent obtained:  Verbal   Consent given by:  Patient   Risks, benefits, and alternatives were discussed: yes     Risks discussed:  Need for additional repair, poor cosmetic result and pain Anesthesia:    Anesthesia method:  Local infiltration   Local anesthetic:  Lidocaine 1% w/o epi Laceration details:    Location:  Finger   Finger location:  R thumb   Length (cm):  2 Pre-procedure  details:    Preparation:  Patient was prepped and draped in usual sterile fashion Exploration:    Hemostasis achieved with:  Direct pressure   Wound exploration: entire depth of wound visualized     Wound extent: no vascular damage     Contaminated: no   Treatment:    Area cleansed with:  Saline   Amount of cleaning:  Standard   Irrigation solution:  Sterile saline   Irrigation method:  Syringe   Visualized foreign bodies/material removed: no   Skin repair:    Repair method:  Sutures   Suture size:  5-0   Suture material:  Prolene   Suture technique:  Simple interrupted Approximation:    Approximation:  Close Repair type:    Repair type:  Simple Post-procedure details:    Dressing:  Antibiotic ointment   Procedure completion:  Tolerated well, no immediate complications     Medications Ordered in ED Medications  bacitracin ointment (has no administration in time range)  Tdap (BOOSTRIX) injection 0.5 mL (0.5 mLs Intramuscular Given 03/26/23 2241)  lidocaine (PF) (XYLOCAINE) 1 % injection 5 mL (5 mLs Infiltration Given 03/26/23 2245)    ED Course/ Medical Decision Making/ A&P                                 Medical Decision Making Risk OTC drugs. Prescription drug management.   Patient is a 72 year old who presents with a laceration to her right thumb.  This was repaired in the ED.  Bleeding was controlled.  No foreign bodies were visualized.  The skin flap was a little dusky.  I did give her precautions about this.  Encouraged her to follow-up with her primary care doctor in 7 to 10 days for suture removal.  Her tetanus shot was updated.  Wound care instructions were given.  Return precautions given.  Final Clinical Impression(s) / ED Diagnoses Final diagnoses:  Laceration of right thumb without foreign body without damage to nail, initial encounter    Rx / DC Orders ED Discharge Orders     None         Rolan Bucco, MD 03/26/23 2302

## 2023-04-05 DIAGNOSIS — S61011A Laceration without foreign body of right thumb without damage to nail, initial encounter: Secondary | ICD-10-CM | POA: Diagnosis not present

## 2023-04-05 DIAGNOSIS — M79644 Pain in right finger(s): Secondary | ICD-10-CM | POA: Diagnosis not present

## 2023-04-25 ENCOUNTER — Ambulatory Visit: Payer: Self-pay | Admitting: General Surgery

## 2023-04-25 NOTE — Pre-Procedure Instructions (Signed)
Surgical Instructions   Your procedure is scheduled on May 04, 2023. Report to [facility] Main Entrance "A" at 5:30 A.M., then check in with the Admitting office. Any questions or running late day of surgery: call [phone]  Questions prior to your surgery date: call [phone], Monday-Friday, 8am-4pm. If you experience any cold or flu symptoms such as cough, fever, chills, shortness of breath, etc. between now and your scheduled surgery, please notify us at the above number.     Remember:  Do not eat after midnight the night before your surgery   You may drink clear liquids until 4:30 AM the morning of your surgery.   Clear liquids allowed are: Water, Non-Citrus Juices (without pulp), Carbonated Beverages, Clear Tea (no milk, honey, etc.), Black Coffee Only (NO MILK, CREAM OR POWDERED CREAMER of any kind), and Gatorade.  Patient Instructions  The night before surgery:  No food after midnight. ONLY clear liquids after midnight  The day of surgery (if you do NOT have diabetes):  Drink ONE (1) Pre-Surgery Clear Ensure by 4:30 AM the morning of surgery. Drink in one sitting. Do not sip.  This drink was given to you during your hospital  pre-op appointment visit.  Nothing else to drink after completing the  Pre-Surgery Clear Ensure.         If you have questions, please contact your surgeon's office.    Take these medicines the morning of surgery with A SIP OF WATER: carbamazepine (TEGRETOL)  escitalopram (LEXAPRO)  rosuvastatin (CRESTOR)    One week prior to surgery, STOP taking any Aspirin (unless otherwise instructed by your surgeon) Aleve, Naproxen, Ibuprofen, Motrin, Advil, Goody's, BC's, all herbal medications, fish oil, and non-prescription vitamins.                     Do NOT Smoke (Tobacco/Vaping) for 24 hours prior to your procedure.  If you use a CPAP at night, you may bring your mask/headgear for your overnight stay.   You will be asked to remove any  contacts, glasses, piercing's, hearing aid's, dentures/partials prior to surgery. Please bring cases for these items if needed.    Patients discharged the day of surgery will not be allowed to drive home, and someone needs to stay with them for 24 hours.  SURGICAL WAITING ROOM VISITATION Patients may have no more than 2 support people in the waiting area - these visitors may rotate.   Pre-op nurse will coordinate an appropriate time for 1 ADULT support person, who may not rotate, to accompany patient in pre-op.  Children under the age of 40 must have an adult with them who is not the patient and must remain in the main waiting area with an adult.  If the patient needs to stay at the hospital during part of their recovery, the visitor guidelines for inpatient rooms apply.  Please refer to the [facility] website for the visitor guidelines for any additional information.   If you received a COVID test during your pre-op visit  it is requested that you wear a mask when out in public, stay away from anyone that may not be feeling well and notify your surgeon if you develop symptoms. If you have been in contact with anyone that has tested positive in the last 10 days please notify you surgeon.      Pre-operative CHG Bathing Instructions   You can play a key role in reducing the risk of infection after surgery. Your skin needs to  be as free of germs as possible. You can reduce the number of germs on your skin by washing with CHG (chlorhexidine gluconate) soap before surgery. CHG is an antiseptic soap that kills germs and continues to kill germs even after washing.   DO NOT use if you have an allergy to chlorhexidine/CHG or antibacterial soaps. If your skin becomes reddened or irritated, stop using the CHG and notify one of our RNs at [phone].              TAKE A SHOWER THE NIGHT BEFORE SURGERY AND THE DAY OF SURGERY    Please keep in mind the following:  DO NOT shave, including legs and  underarms, 48 hours prior to surgery.   You may shave your face before/day of surgery.  Place clean sheets on your bed the night before surgery Use a clean washcloth (not used since being washed) for each shower. DO NOT sleep with pet's night before surgery.  CHG Shower Instructions:  Wash your face and private area with normal soap. If you choose to wash your hair, wash first with your normal shampoo.  After you use shampoo/soap, rinse your hair and body thoroughly to remove shampoo/soap residue.  Turn the water OFF and apply half the bottle of CHG soap to a CLEAN washcloth.  Apply CHG soap ONLY FROM YOUR NECK DOWN TO YOUR TOES (washing for 3-5 minutes)  DO NOT use CHG soap on face, private areas, open wounds, or sores.  Pay special attention to the area where your surgery is being performed.  If you are having back surgery, having someone wash your back for you may be helpful. Wait 2 minutes after CHG soap is applied, then you may rinse off the CHG soap.  Pat dry with a clean towel  Put on clean pajamas    Additional instructions for the day of surgery: DO NOT APPLY any lotions, deodorants, cologne, or perfumes.   Do not wear jewelry or makeup Do not wear nail polish, gel polish, artificial nails, or any other type of covering on natural nails (fingers and toes) Do not bring valuables to the hospital. [facility] is not responsible for valuables/personal belongings. Put on clean/comfortable clothes.  Please brush your teeth.  Ask your nurse before applying any prescription medications to the skin.

## 2023-04-26 ENCOUNTER — Ambulatory Visit: Payer: Self-pay | Admitting: General Surgery

## 2023-04-26 ENCOUNTER — Encounter (HOSPITAL_COMMUNITY): Payer: Self-pay

## 2023-04-26 ENCOUNTER — Encounter (HOSPITAL_COMMUNITY)
Admission: RE | Admit: 2023-04-26 | Discharge: 2023-04-26 | Disposition: A | Discharge status: Home / Self Care | Payer: Medicare Other | Source: Ambulatory Visit | Attending: General Surgery | Admitting: General Surgery

## 2023-04-26 ENCOUNTER — Other Ambulatory Visit: Payer: Self-pay

## 2023-04-26 VITALS — BP 115/66 | HR 65 | Temp 98.1°F | Resp 18 | Ht 66.0 in | Wt 134.4 lb

## 2023-04-26 DIAGNOSIS — I2510 Atherosclerotic heart disease of native coronary artery without angina pectoris: Secondary | ICD-10-CM | POA: Insufficient documentation

## 2023-04-26 DIAGNOSIS — Z01818 Encounter for other preprocedural examination: Secondary | ICD-10-CM | POA: Diagnosis not present

## 2023-04-26 HISTORY — DX: Hypo-osmolality and hyponatremia: E87.1

## 2023-04-26 HISTORY — DX: Other complications of anesthesia, initial encounter: T88.59XA

## 2023-04-26 HISTORY — DX: Personal history of other diseases of the digestive system: Z87.19

## 2023-04-26 LAB — BASIC METABOLIC PANEL
Anion gap: 4 — ABNORMAL LOW (ref 5–15)
BUN: 14 mg/dL (ref 8–23)
CO2: 29 mmol/L (ref 22–32)
Calcium: 9 mg/dL (ref 8.9–10.3)
Chloride: 92 mmol/L — ABNORMAL LOW (ref 98–111)
Creatinine, Ser: 0.75 mg/dL (ref 0.44–1.00)
GFR, Estimated: >60 mL/min (ref >60)
Glucose, Bld: 108 mg/dL — ABNORMAL HIGH (ref 70–99)
Potassium: 4.2 mmol/L (ref 3.5–5.1)
Sodium: 125 mmol/L — ABNORMAL LOW (ref 135–145)

## 2023-04-26 LAB — CBC
HCT: 36.2 % (ref 36.0–46.0)
Hemoglobin: 12.2 g/dL (ref 12.0–15.0)
MCH: 31 pg (ref 26.0–34.0)
MCHC: 33.7 g/dL (ref 30.0–36.0)
MCV: 91.9 fL (ref 80.0–100.0)
Platelets: 274 10*3/uL (ref 150–400)
RBC: 3.94 MIL/uL (ref 3.87–5.11)
RDW: 12.7 % (ref 11.5–15.5)
WBC: 3.6 10*3/uL — ABNORMAL LOW (ref 4.0–10.5)
nRBC: 0 % (ref 0.0–0.2)

## 2023-04-26 NOTE — Progress Notes (Addendum)
PCP - Dr. Ivery Quale Cardiologist - Pt saw Dr. Eldridge Dace in 2015 with SOB. Work-up normal. PRN follow-up  PPM/ICD - Denies Device Orders - n/a Rep Notified - n/a  Chest x-ray - n/a EKG - 04/26/2023 Stress Test - 04/08/2013 ECHO - 04/09/2013 Cardiac Cath - Denies Cardiac CT - 06/27/2022  Sleep Study - Denies CPAP - n/a  No DM  Last dose of GLP1 agonist- n/a GLP1 instructions: n/a  Blood Thinner Instructions: n/a Aspirin Instructions: n/a  ERAS Protcol - Clear liquids until 0430 morning of surgery PRE-SURGERY Ensure or G2- Ensure given to pt with instructions  COVID TEST- n/a   Anesthesia review: Yes. EKG review and abnormal sodium result 125  Patient denies shortness of breath, fever, cough and chest pain at PAT appointment. Pt states she had a sinus infection just at two months ago. She has had symptom resolution for over one month. No other respiratory illness/infection    All instructions explained to the patient, with a verbal understanding of the material. Patient agrees to go over the instructions while at home for a better understanding. Patient also instructed to self quarantine after being tested for COVID-19. The opportunity to ask questions was provided.

## 2023-04-30 ENCOUNTER — Encounter (HOSPITAL_COMMUNITY): Payer: Self-pay

## 2023-04-30 NOTE — Progress Notes (Addendum)
Anesthesia Chart Review:  Case: [id number] Date/Time: 05/04/23 0715   Procedures:      EPIGASTRIC HERNIA REPAIR WITH MESH     RIGHT INGUINAL HERNIA REPAIR WITH MESH (Right)   Anesthesia type: General   Pre-op diagnosis: Epigastric Hernia, Right Inguinal Hernia   Location: [facility] ROOM 02 / [facility] OR   Surgeons: Violeta Gelinas, MD       DISCUSSION: Patient is a 73 year old female scheduled for the above procedure.  History includes never smoker, HTN, hypercholesterolemia, hyponatremia, hiatal hernia, trigeminal neuralgia, umbilical hernia repair. She reported hypotension after her colonoscopy and had to have IV reinserted for IVF and be monitored longer before discharge home.   She is not followed routinely by cardiology by had evaluation by Lance Muss, MD in 03/2013 for dyspnea and had normal ETT and echo. She reported more recent unremarkable testing around 2022 when she had a presyncope spell in [place], [place]. She believes testing was done at [facility]. She gets rare dizziness which she thinks may be related to lower BP, but denied syncope, chest pain, SOB. She says she is very active, recently moving furniture and loading/unpacking 80 boxes into their new home in [place].   Preoperative labs show hyponatremia with Na 125. She is on Tegretol and Lexapro which could be contributing. I called and spoke with Ms. Halvorsen. She reported known chronic hyponatremia, believed due to Tegretol. She said results are typically around 130. She denied recent gastrointestinal illness or increase in water intake. (She cannot estimate her actual daily water intake, but feels up her 40 oz water bottle 2-3 times/day, more so during the summer months.). She denied fatigues, weakness, N/V, brain fog.   I have reached out to Dr. Silvano Rusk office regarding lab results to see if he can provide her with additional recommendations and repeat labs between now and surgery. Will leave chart for  follow-up.   ADDENDUM 04/30/23 4:59 PM: McKayla from Dr. Norval Gable office called. He is going to have her start sodium chloride tablets 1 gm twice daily and will do in-house labs on 05/03/23. She will fax the results to PAT. I sent update to Dr. Janee Morn.    VS: BP 115/66   Pulse 65   Temp 36.7 C   Resp 18   Ht 5\' 6"  (1.676 m)   Wt 61 kg   LMP 04/03/2002 (Approximate)   SpO2 100%   BMI 21.69 kg/m   PROVIDERS: Garlan Fillers, MD is PCP. Will request records. She says he should have testing done ~ 2022 in [place].   LABS: Preoperative labs noted. See DISCUSSION. (all labs ordered are listed, but only abnormal results are displayed)  Labs Reviewed  BASIC METABOLIC PANEL - Abnormal; Notable for the following components:      Result Value   Sodium 125 (*)    Chloride 92 (*)    Glucose, Bld 108 (*)    Anion gap 4 (*)    All other components within normal limits  CBC - Abnormal; Notable for the following components:   WBC 3.6 (*)    All other components within normal limits    IMAGES: DG UGI 05/29/22: IMPRESSION: 1. Small sliding-type hiatal hernia. 2. No reflux visualized.    EKG: 04/26/23: Normal sinus rhythm Septal infarct , age undetermined Nonspecific T wave abnormality Abnormal ECG No previous ECGs available Confirmed by Rinaldo Cloud ([id number]) on 04/26/2023 10:50:59 AM - Comparison EKG from 03/18/13 showed NSR, possible LAD.  Negative T wave in V2 is more pronounced on 2023/05/24 tracing.    CV: CT Cardiac Calcium Scoring 06/27/22 (ordered by PCP Dr. Eloise Harman): IMPRESSION: 1. Total calcium score of 46.6 is at percentile 54 for subjects of the same age, gender, and race/ethnicity. 2. Aortic Atherosclerosis (ICD10-I70.0).   Echo 04/09/13: Study Conclusions  Left ventricle: The cavity size was normal. Systolic  function was normal. The estimated ejection fraction was in  the range of 55% to 60%. Wall motion was normal; there were  no regional wall motion  abnormalities. Left ventricular  diastolic function parameters were normal.   Normal ETT on 04/08/13.   Past Medical History:  Diagnosis Date   Arthritis    CIN I (cervical intraepithelial neoplasia I)    Complication of anesthesia    Low BP after last colonoscopy   Elevated cholesterol    History of hiatal hernia    Hypertension    Lumbar herniated disc 04/04/2011   Trigeminal neuralgia    Umbilical hernia     Past Surgical History:  Procedure Laterality Date   CERVICAL BIOPSY  W/ LOOP ELECTRODE EXCISION  [id number]   X 2   COLPOSCOPY     DILATION AND CURETTAGE OF UTERUS     TYMPANOSTOMY TUBE PLACEMENT  2016   Done in office   UMBILICAL HERNIA REPAIR     As a baby    MEDICATIONS:  amLODipine-olmesartan (AZOR) 5-20 MG tablet   carbamazepine (TEGRETOL) 200 MG tablet   escitalopram (LEXAPRO) 10 MG tablet   Probiotic Product (PROBIOTIC-10 ULTIMATE PO)   rosuvastatin (CRESTOR) 10 MG tablet   No current facility-administered medications for this encounter.    Shonna Chock, PA-C Surgical Short Stay/Anesthesiology [facility] Phone [phone] [facility] Phone [phone] 04/30/2023 11:26 AM

## 2023-05-03 NOTE — Anesthesia Preprocedure Evaluation (Addendum)
Anesthesia Evaluation  Patient identified by MRN, date of birth, ID band Patient awake    Reviewed: Allergy & Precautions, NPO status , Patient's Chart, lab work & pertinent test results, reviewed documented beta blocker date and time   History of Anesthesia Complications (+) history of anesthetic complications  Airway Mallampati: II       Dental no notable dental hx. (+) Caps, Dental Advisory Given   Pulmonary neg pulmonary ROS   Pulmonary exam normal breath sounds clear to auscultation       Cardiovascular hypertension, Pt. on medications Normal cardiovascular exam Rhythm:Regular Rate:Normal     Neuro/Psych  Neuromuscular disease  negative psych ROS   GI/Hepatic Neg liver ROS, hiatal hernia,,,Epigastric hernia   Endo/Other  negative endocrine ROS  Hyperlipidemia  Renal/GU negative Renal ROS  negative genitourinary   Musculoskeletal  (+) Arthritis , Osteoarthritis,   Epigastric Hernia Right Inguinal Hernia   Abdominal   Peds  Hematology negative hematology ROS (+)   Anesthesia Other Findings   Reproductive/Obstetrics                              Anesthesia Physical Anesthesia Plan  ASA: 2  Anesthesia Plan: General   Post-op Pain Management: Minimal or no pain anticipated and Dilaudid IV   Induction:   PONV Risk Score and Plan: 4 or greater and Treatment may vary due to age or medical condition, Ondansetron and Dexamethasone  Airway Management Planned: Oral ETT  Additional Equipment: None  Intra-op Plan:   Post-operative Plan: Extubation in OR  Informed Consent: I have reviewed the patients History and Physical, chart, labs and discussed the procedure including the risks, benefits and alternatives for the proposed anesthesia with the patient or authorized representative who has indicated his/her understanding and acceptance.     Dental advisory given  Plan Discussed  with: Anesthesiologist and CRNA  Anesthesia Plan Comments: (See PAT note written by Shonna Chock, PA-C. History of chronic hyponatremia, believed due to Tegretol. Started on NaCl tablets 04/30/23 with recheck Na 128 on 05/03/23 at [facility].   )       Anesthesia Quick Evaluation

## 2023-05-04 ENCOUNTER — Encounter (HOSPITAL_COMMUNITY)
Admission: RE | Disposition: A | Discharge status: Home / Self Care | Payer: Self-pay | Source: Home / Self Care | Attending: General Surgery

## 2023-05-04 ENCOUNTER — Ambulatory Visit (HOSPITAL_COMMUNITY)
Admission: RE | Admit: 2023-05-04 | Discharge: 2023-05-04 | Disposition: A | Discharge status: Home / Self Care | Payer: Medicare Other | Attending: General Surgery | Admitting: General Surgery

## 2023-05-04 ENCOUNTER — Ambulatory Visit (HOSPITAL_COMMUNITY): Payer: Medicare Other | Admitting: Vascular Surgery

## 2023-05-04 ENCOUNTER — Other Ambulatory Visit: Payer: Self-pay

## 2023-05-04 ENCOUNTER — Ambulatory Visit (HOSPITAL_BASED_OUTPATIENT_CLINIC_OR_DEPARTMENT_OTHER): Payer: Medicare Other | Admitting: Anesthesiology

## 2023-05-04 ENCOUNTER — Encounter (HOSPITAL_COMMUNITY): Payer: Self-pay | Admitting: General Surgery

## 2023-05-04 DIAGNOSIS — K449 Diaphragmatic hernia without obstruction or gangrene: Secondary | ICD-10-CM | POA: Insufficient documentation

## 2023-05-04 DIAGNOSIS — K4190 Unilateral femoral hernia, without obstruction or gangrene, not specified as recurrent: Secondary | ICD-10-CM | POA: Diagnosis not present

## 2023-05-04 DIAGNOSIS — R590 Localized enlarged lymph nodes: Secondary | ICD-10-CM | POA: Diagnosis not present

## 2023-05-04 DIAGNOSIS — I10 Essential (primary) hypertension: Secondary | ICD-10-CM | POA: Insufficient documentation

## 2023-05-04 DIAGNOSIS — Z79899 Other long term (current) drug therapy: Secondary | ICD-10-CM | POA: Insufficient documentation

## 2023-05-04 DIAGNOSIS — K439 Ventral hernia without obstruction or gangrene: Secondary | ICD-10-CM | POA: Diagnosis not present

## 2023-05-04 DIAGNOSIS — I898 Other specified noninfective disorders of lymphatic vessels and lymph nodes: Secondary | ICD-10-CM | POA: Insufficient documentation

## 2023-05-04 DIAGNOSIS — M1990 Unspecified osteoarthritis, unspecified site: Secondary | ICD-10-CM | POA: Insufficient documentation

## 2023-05-04 DIAGNOSIS — Z800 Family history of malignant neoplasm of digestive organs: Secondary | ICD-10-CM | POA: Diagnosis not present

## 2023-05-04 DIAGNOSIS — E785 Hyperlipidemia, unspecified: Secondary | ICD-10-CM | POA: Diagnosis not present

## 2023-05-04 HISTORY — PX: EPIGASTRIC HERNIA REPAIR: SHX404

## 2023-05-04 HISTORY — PX: FEMORAL HERNIA REPAIR: SHX632

## 2023-05-04 SURGERY — REPAIR, HERNIA, EPIGASTRIC, ADULT
Anesthesia: General | Laterality: Right

## 2023-05-04 MED ORDER — MIDAZOLAM HCL 2 MG/2ML IJ SOLN
INTRAMUSCULAR | Status: AC
  Filled 2023-05-04: qty 2

## 2023-05-04 MED ORDER — TRAMADOL HCL 50 MG PO TABS: 50.0000 mg | ORAL_TABLET | Freq: Four times a day (QID) | ORAL | 0 refills | Status: AC | PRN

## 2023-05-04 MED ORDER — PHENYLEPHRINE 80 MCG/ML (10ML) SYRINGE FOR IV PUSH (FOR BLOOD PRESSURE SUPPORT)
PREFILLED_SYRINGE | INTRAVENOUS | Status: DC | PRN
  Administered 2023-05-04: 40 ug via INTRAVENOUS
  Administered 2023-05-04: 80 ug via INTRAVENOUS

## 2023-05-04 MED ORDER — SUGAMMADEX SODIUM 200 MG/2ML IV SOLN: INTRAVENOUS | Status: DC | PRN

## 2023-05-04 MED ORDER — BUPIVACAINE-EPINEPHRINE 0.25% -1:200000 IJ SOLN
INTRAMUSCULAR | Status: DC | PRN
  Administered 2023-05-04: 17 mL

## 2023-05-04 MED ORDER — SUGAMMADEX SODIUM 200 MG/2ML IV SOLN
INTRAVENOUS | Status: DC | PRN
  Administered 2023-05-04: 150 mg via INTRAVENOUS

## 2023-05-04 MED ORDER — CHLORHEXIDINE GLUCONATE CLOTH 2 % EX PADS: 6.0000 | MEDICATED_PAD | Freq: Once | CUTANEOUS | Status: DC

## 2023-05-04 MED ORDER — ONDANSETRON HCL 4 MG/2ML IJ SOLN
INTRAMUSCULAR | Status: DC | PRN
  Administered 2023-05-04: 4 mg via INTRAVENOUS

## 2023-05-04 MED ORDER — EPHEDRINE SULFATE-NACL 50-0.9 MG/10ML-% IV SOSY
PREFILLED_SYRINGE | INTRAVENOUS | Status: DC | PRN
  Administered 2023-05-04: 10 mg via INTRAVENOUS
  Administered 2023-05-04: 5 mg via INTRAVENOUS
  Administered 2023-05-04: 10 mg via INTRAVENOUS
  Administered 2023-05-04: 5 mg via INTRAVENOUS
  Administered 2023-05-04: 10 mg via INTRAVENOUS
  Administered 2023-05-04: 5 mg via INTRAVENOUS

## 2023-05-04 MED ORDER — TRAMADOL HCL 50 MG PO TABS
ORAL_TABLET | ORAL | Status: AC
  Filled 2023-05-04: qty 1

## 2023-05-04 MED ORDER — DEXAMETHASONE SODIUM PHOSPHATE 10 MG/ML IJ SOLN
INTRAMUSCULAR | Status: DC | PRN
  Administered 2023-05-04: 4 mg via INTRAVENOUS

## 2023-05-04 MED ORDER — BUPIVACAINE-EPINEPHRINE (PF) 0.25% -1:200000 IJ SOLN
INTRAMUSCULAR | Status: AC
  Filled 2023-05-04: qty 30

## 2023-05-04 MED ORDER — TRAMADOL HCL 50 MG PO TABS
50.0000 mg | ORAL_TABLET | Freq: Once | ORAL | Status: AC
  Administered 2023-05-04: 50 mg via ORAL

## 2023-05-04 MED ORDER — ROCURONIUM BROMIDE 10 MG/ML (PF) SYRINGE
PREFILLED_SYRINGE | INTRAVENOUS | Status: DC | PRN
  Administered 2023-05-04: 60 mg via INTRAVENOUS
  Administered 2023-05-04: 20 mg via INTRAVENOUS

## 2023-05-04 MED ORDER — HYDROMORPHONE HCL 1 MG/ML IJ SOLN: 0.2500 mg | INTRAMUSCULAR | Status: DC | PRN

## 2023-05-04 MED ORDER — CHLORHEXIDINE GLUCONATE 0.12 % MT SOLN
15.0000 mL | Freq: Once | OROMUCOSAL | Status: AC
  Administered 2023-05-04: 15 mL via OROMUCOSAL
  Filled 2023-05-04: qty 15

## 2023-05-04 MED ORDER — ONDANSETRON HCL 4 MG/2ML IJ SOLN: 4.0000 mg | Freq: Once | INTRAMUSCULAR | Status: DC | PRN

## 2023-05-04 MED ORDER — LACTATED RINGERS IV SOLN: INTRAVENOUS | Status: DC

## 2023-05-04 MED ORDER — PROPOFOL 10 MG/ML IV BOLUS
INTRAVENOUS | Status: AC
  Filled 2023-05-04: qty 20

## 2023-05-04 MED ORDER — AMISULPRIDE (ANTIEMETIC) 5 MG/2ML IV SOLN: 10.0000 mg | Freq: Once | INTRAVENOUS | Status: DC | PRN

## 2023-05-04 MED ORDER — LIDOCAINE 2% (20 MG/ML) 5 ML SYRINGE
INTRAMUSCULAR | Status: DC | PRN
  Administered 2023-05-04: 60 mg via INTRAVENOUS

## 2023-05-04 MED ORDER — FENTANYL CITRATE (PF) 250 MCG/5ML IJ SOLN
INTRAMUSCULAR | Status: AC
  Filled 2023-05-04: qty 5

## 2023-05-04 MED ORDER — MIDAZOLAM HCL 2 MG/2ML IJ SOLN
INTRAMUSCULAR | Status: DC | PRN
  Administered 2023-05-04: 2 mg via INTRAVENOUS

## 2023-05-04 MED ORDER — FENTANYL CITRATE (PF) 250 MCG/5ML IJ SOLN
INTRAMUSCULAR | Status: DC | PRN
  Administered 2023-05-04: 100 ug via INTRAVENOUS

## 2023-05-04 MED ORDER — ORAL CARE MOUTH RINSE: 15.0000 mL | Freq: Once | OROMUCOSAL | Status: AC

## 2023-05-04 MED ORDER — ACETAMINOPHEN 500 MG PO TABS
1000.0000 mg | ORAL_TABLET | ORAL | Status: AC
  Administered 2023-05-04: 1000 mg via ORAL
  Filled 2023-05-04: qty 2

## 2023-05-04 MED ORDER — OXYCODONE HCL 5 MG PO TABS: 5.0000 mg | ORAL_TABLET | Freq: Once | ORAL | Status: DC | PRN

## 2023-05-04 MED ORDER — ENSURE PRE-SURGERY PO LIQD: 296.0000 mL | Freq: Once | ORAL | Status: DC

## 2023-05-04 MED ORDER — CEFAZOLIN SODIUM-DEXTROSE 2-4 GM/100ML-% IV SOLN
2.0000 g | INTRAVENOUS | Status: AC
  Administered 2023-05-04: 2 g via INTRAVENOUS
  Filled 2023-05-04: qty 100

## 2023-05-04 MED ORDER — 0.9 % SODIUM CHLORIDE (POUR BTL) OPTIME
TOPICAL | Status: DC | PRN
  Administered 2023-05-04: 1000 mL

## 2023-05-04 MED ORDER — OXYCODONE HCL 5 MG/5ML PO SOLN: 5.0000 mg | Freq: Once | ORAL | Status: DC | PRN

## 2023-05-04 MED ORDER — PROPOFOL 10 MG/ML IV BOLUS
INTRAVENOUS | Status: DC | PRN
  Administered 2023-05-04: 100 mg via INTRAVENOUS

## 2023-05-04 SURGICAL SUPPLY — 34 items
BAG COUNTER SPONGE SURGICOUNT (BAG) ×3 IMPLANT
BLADE CLIPPER SURG (BLADE) IMPLANT
CANISTER SUCT 3000ML PPV (MISCELLANEOUS) IMPLANT
CHLORAPREP W/TINT 26 (MISCELLANEOUS) ×3 IMPLANT
COVER SURGICAL LIGHT HANDLE (MISCELLANEOUS) ×3 IMPLANT
DERMABOND ADVANCED .7 DNX12 (GAUZE/BANDAGES/DRESSINGS) ×3 IMPLANT
DRAIN PENROSE .5X12 LATEX STL (DRAIN) IMPLANT
DRAPE LAPAROTOMY 100X72 PEDS (DRAPES) ×3 IMPLANT
ELECT REM PT RETURN 9FT ADLT (ELECTROSURGICAL) ×2
ELECTRODE REM PT RTRN 9FT ADLT (ELECTROSURGICAL) ×3 IMPLANT
GLOVE BIO SURGEON STRL SZ8 (GLOVE) ×3 IMPLANT
GLOVE BIOGEL PI IND STRL 8 (GLOVE) ×3 IMPLANT
GOWN STRL REUS W/ TWL LRG LVL3 (GOWN DISPOSABLE) ×3 IMPLANT
GOWN STRL REUS W/ TWL XL LVL3 (GOWN DISPOSABLE) ×3 IMPLANT
KIT BASIN OR (CUSTOM PROCEDURE TRAY) ×3 IMPLANT
KIT TURNOVER KIT B (KITS) ×3 IMPLANT
MESH BARD SOFT 3X6IN (Mesh General) IMPLANT
MESH VENTRALEX ST 1-7/10 CRC S (Mesh General) IMPLANT
NDL 22X1.5 STRL (OR ONLY) (MISCELLANEOUS) ×3 IMPLANT
NEEDLE 22X1.5 STRL (OR ONLY) (MISCELLANEOUS) ×2
NS IRRIG 1000ML POUR BTL (IV SOLUTION) ×3 IMPLANT
PACK GENERAL/GYN (CUSTOM PROCEDURE TRAY) ×3 IMPLANT
PAD ARMBOARD 7.5X6 YLW CONV (MISCELLANEOUS) ×3 IMPLANT
SPECIMEN JAR SMALL (MISCELLANEOUS) IMPLANT
SUT MNCRL AB 4-0 PS2 18 (SUTURE) ×3 IMPLANT
SUT PROLENE 2 0 CT2 30 (SUTURE) ×9 IMPLANT
SUT PROLENE 2 0 SH DA (SUTURE) IMPLANT
SUT VIC AB 2-0 SH 27X BRD (SUTURE) ×3 IMPLANT
SUT VIC AB 2-0 SH 27XBRD (SUTURE) IMPLANT
SUT VIC AB 3-0 SH 27X BRD (SUTURE) ×3 IMPLANT
SUT VICRYL AB 3 0 TIES (SUTURE) IMPLANT
SYR CONTROL 10ML LL (SYRINGE) ×3 IMPLANT
TOWEL GREEN STERILE (TOWEL DISPOSABLE) ×3 IMPLANT
TOWEL GREEN STERILE FF (TOWEL DISPOSABLE) ×3 IMPLANT

## 2023-05-04 NOTE — Op Note (Signed)
05/04/2023  8:59 AM  PATIENT:  Kimberly Baxter  73 y.o. female  PRE-OPERATIVE DIAGNOSIS:  Epigastric Hernia, Right Inguinal Hernia  POST-OPERATIVE DIAGNOSIS:  Epigastric Hernia, Right Femoral Hernia.  PROCEDURE:  Procedure(s): EPIGASTRIC HERNIA REPAIR WITH MESH - HERNIA DEFECT !CM RIGHT FEMORAL HERNIA REPAIR WITH MESH - HERNIA DEFECT 2CM RIGHT INGUINAL LYMPH NODE BIOPSY  SURGEON:  Surgeon(s): Violeta Gelinas, MD  ASSISTANTS: none   ANESTHESIA:   local and general  EBL:  Total I/O In: 800 [I.V.:800] Out: 5 [Blood:5]  BLOOD ADMINISTERED:none  DRAINS: none   SPECIMEN:  Excision  DISPOSITION OF SPECIMEN:  PATHOLOGY  COUNTS:  YES  DICTATION: .Dragon Dictation Findings: The hernia in her right inguinal region was actually a femoral hernia passing out below her inguinal ligament.  She also had a very enlarged lymph node in her right inguinal region which I removed and sent to pathology.  Procedure in detail: Informed consent was obtained.  Both of her sites were marked.  She received intravenous antibiotics.  She was brought to the operating room and general endotracheal anesthesia was administered by the anesthesia staff.  We did a timeout procedure.  Attention was first directed to the right inguinal region.  Local was injected over the palpable hernia.  Subcutaneous tissues were dissected down through Scarpa's fascia identifying the hernia sac.  This hernia actually arose from beneath the inguinal ligament consistent with a femoral hernia.  It was circumferentially dissected and reduced back into the abdomen without opening the sac.  To complete the repair, I formed a plug out of a polypropylene mesh.  This was adjusted to appropriate size and placed in the defect.  The hernia defect was 2 cm.  The mesh was secured with multiple interrupted 2-0 Prolene sutures.  Hemostasis was obtained.  The area was irrigated.  She had a very large inguinal lymph node within the exposure area.  Due  to its size over 2 cm I elected to remove it and send it to pathology.  It was dissected out using cautery.  Hemostasis was ensured.  Subcutaneous tissues were closed with interrupted 2-0 Vicryl.  More superficial subcutaneous tissues were closed with interrupted 3-0 Vicryl and the skin was closed with 4-0 Monocryl subcuticular followed by Dermabond.  Counts were correct for this portion.  Attention was directed to the epigastric hernia.  Local was injected over the palpable hernia.  A vertical incision was made above the umbilicus.  Subcutaneous tissues were dissected down revealing the hernia sac.  It was circumferentially dissected.  The fascial defect was only about 1 cm.  The fascia was opened slightly to allow the hernia to be reduced.  It reduced nicely and the fascia was freed up circumferentially.  Next the hernia was repaired in an inlay fashion using a circular Ventralex mesh, 4.3 cm.  The mesh was inserted and laid up nicely up against the fascia.  The superior strap was tacked to the fascia with interrupted 2-0 Vicryl.  Similarly, the inferior strap was tacked to the fascia with interrupted 2-0 Vicryl.  I then closed the fascia over the mesh using interrupted 2-0 Vicryl's and incorporating the mesh into the sutures.  Hemostasis was ensured.  The area was again irrigated.  Deep tissues were approximated with interrupted 2-0 Vicryl.  Subcutaneous tissues were approximated with interrupted 3-0 Vicryl.  Skin was closed with 4-0 Monocryl followed by Dermabond.  All counts were correct.  She tolerated the procedures well without apparent complication and was taken recovery  in stable condition. PATIENT DISPOSITION:  PACU - hemodynamically stable.   Delay start of Pharmacological VTE agent (>24hrs) due to surgical blood loss or risk of bleeding:  no  Violeta Gelinas, MD, MPH, FACS Pager: [phone]  1/31/20258:59 AM

## 2023-05-04 NOTE — H&P (Signed)
Kimberly Baxter is an 73 y.o. female.   Chief Complaint: Epigastric and right inguinal hernias HPI: Patient presents for repair of epigastric and right inguinal hernia.  Her symptoms have been about the same since I saw her in the office.  No new issues.  Past Medical History:  Diagnosis Date   Arthritis    CIN I (cervical intraepithelial neoplasia I)    Complication of anesthesia    Low BP after last colonoscopy   Elevated cholesterol    History of hiatal hernia    Hypertension    Hyponatremia    reported chronic hyponatremia, believed due to Tegretol (04/30/23)   Lumbar herniated disc 04/04/2011   Trigeminal neuralgia    Umbilical hernia     Past Surgical History:  Procedure Laterality Date   CERVICAL BIOPSY  W/ LOOP ELECTRODE EXCISION  [id number]   X 2   COLPOSCOPY     DILATION AND CURETTAGE OF UTERUS     TYMPANOSTOMY TUBE PLACEMENT  2016   Done in office   UMBILICAL HERNIA REPAIR     As a baby    Family History  Problem Relation Age of Onset   Colon cancer Father    Pancreatic cancer Father    Stomach cancer Father    Diabetes Maternal Grandmother    Heart disease Maternal Grandmother    Hypertension Maternal Grandfather    Heart disease Maternal Grandfather    Cancer Mother        Carcinoid syndrome   Social History:  reports that she has never smoked. She has never used smokeless tobacco. She reports that she does not currently use alcohol. She reports that she does not use drugs.  Allergies: No Known Allergies  Medications Prior to Admission  Medication Sig Dispense Refill   amLODipine-olmesartan (AZOR) 5-20 MG tablet Take 1 tablet by mouth daily.     carbamazepine (TEGRETOL) 200 MG tablet Take 300 mg by mouth 2 (two) times daily.     escitalopram (LEXAPRO) 10 MG tablet Take 10 mg by mouth daily.     Probiotic Product (PROBIOTIC-10 ULTIMATE PO) Take 1 tablet by mouth daily.     rosuvastatin (CRESTOR) 10 MG tablet Take 10 mg by mouth daily.      No results  found for this or any previous visit (from the past 48 hours). No results found.  Review of Systems  Blood pressure 119/76, pulse 77, temperature 98.3 F (36.8 C), temperature source Oral, resp. rate 18, height 5\' 6"  (1.676 m), weight 60.8 kg, last menstrual period 04/03/2002, SpO2 96%. Physical Exam HENT:     Head: Normocephalic.  Eyes:     Pupils: Pupils are equal, round, and reactive to light.  Cardiovascular:     Rate and Rhythm: Normal rate and regular rhythm.     Pulses: Normal pulses.  Pulmonary:     Effort: Pulmonary effort is normal.     Breath sounds: Normal breath sounds.  Abdominal:     Palpations: Abdomen is soft.     Hernia: A hernia is present.     Comments: Epigastric hernia is reducible, right inguinal hernia is reducible  Skin:    General: Skin is warm.     Capillary Refill: Capillary refill takes 2 to 3 seconds.  Neurological:     Mental Status: She is alert and oriented to person, place, and time.  Psychiatric:        Mood and Affect: Mood normal.      Assessment/Plan Epigastric and  right inguinal hernia -for repair of epigastric hernia with mesh and repair of right inguinal hernia with mesh.  Procedure, risks, and benefits were discussed in detail with her.  She is agreeable.  I also discussed the expected postoperative course and answered her questions.  She describes nausea with oxycodone.  We will try tramadol for postoperative pain relief.  Liz Malady, MD 05/04/2023, 6:49 AM

## 2023-05-04 NOTE — Anesthesia Postprocedure Evaluation (Signed)
Anesthesia Post Note  Patient: Kimberly Baxter  Procedure(s) Performed: EPIGASTRIC HERNIA REPAIR WITH MESH RIGHT FEMORAL HERNIA REPAIR WITH MESH (Right)     Patient location during evaluation: PACU Anesthesia Type: General Level of consciousness: awake and alert and oriented Pain management: pain level controlled Vital Signs Assessment: post-procedure vital signs reviewed and stable Respiratory status: spontaneous breathing, nonlabored ventilation and respiratory function stable Cardiovascular status: blood pressure returned to baseline and stable Postop Assessment: no apparent nausea or vomiting Anesthetic complications: no   There were no known notable events for this encounter.  Last Vitals:  Vitals:   05/04/23 0915 05/04/23 0930  BP: (!) 129/99 129/65  Pulse: 75 70  Resp: 13 12  Temp:  36.4 C  SpO2: 91% 93%    Last Pain:  Vitals:   05/04/23 0852  TempSrc:   PainSc: 0-No pain                 Robertha Staples A.

## 2023-05-04 NOTE — Transfer of Care (Signed)
Immediate Anesthesia Transfer of Care Note  Patient: Kimberly Baxter  Procedure(s) Performed: EPIGASTRIC HERNIA REPAIR WITH MESH RIGHT FEMORAL HERNIA REPAIR WITH MESH (Right)  Patient Location: PACU  Anesthesia Type:General  Level of Consciousness: awake  Airway & Oxygen Therapy: Patient connected to face mask oxygen  Post-op Assessment: Post -op Vital signs reviewed and stable  Post vital signs: stable  Last Vitals:  Vitals Value Taken Time  BP 132/55 05/04/23 0852  Temp 36.6 C 05/04/23 0852  Pulse 85 05/04/23 0857  Resp 16 05/04/23 0857  SpO2 96 % 05/04/23 0857  Vitals shown include unfiled device data.  Last Pain:  Vitals:   05/04/23 0852  TempSrc:   PainSc: 0-No pain         Complications: There were no known notable events for this encounter.

## 2023-05-04 NOTE — Anesthesia Procedure Notes (Signed)
Procedure Name: Intubation Date/Time: 05/04/2023 4:50 AM  Performed by: Hessie Diener, CRNAPre-anesthesia Checklist: Patient identified, Emergency Drugs available, Suction available and Patient being monitored Patient Re-evaluated:Patient Re-evaluated prior to induction Oxygen Delivery Method: Circle System Utilized Preoxygenation: Pre-oxygenation with 100% oxygen Induction Type: IV induction Ventilation: Mask ventilation without difficulty Laryngoscope Size: Mac and 3 Grade View: Grade I Tube type: Oral Tube size: 7.0 mm Number of attempts: 1 Airway Equipment and Method: Stylet and Oral airway Placement Confirmation: ETT inserted through vocal cords under direct vision, positive ETCO2 and breath sounds checked- equal and bilateral Tube secured with: Tape Dental Injury: Teeth and Oropharynx as per pre-operative assessment

## 2023-05-07 ENCOUNTER — Encounter (HOSPITAL_COMMUNITY): Payer: Self-pay | Admitting: General Surgery

## 2023-05-07 LAB — SURGICAL PATHOLOGY

## 2023-05-24 DIAGNOSIS — E782 Mixed hyperlipidemia: Secondary | ICD-10-CM | POA: Diagnosis not present

## 2023-05-24 DIAGNOSIS — Z0189 Encounter for other specified special examinations: Secondary | ICD-10-CM | POA: Diagnosis not present

## 2023-05-24 DIAGNOSIS — R7989 Other specified abnormal findings of blood chemistry: Secondary | ICD-10-CM | POA: Diagnosis not present

## 2023-05-24 DIAGNOSIS — I10 Essential (primary) hypertension: Secondary | ICD-10-CM | POA: Diagnosis not present

## 2023-05-24 DIAGNOSIS — E785 Hyperlipidemia, unspecified: Secondary | ICD-10-CM | POA: Diagnosis not present

## 2023-05-29 DIAGNOSIS — Z9889 Other specified postprocedural states: Secondary | ICD-10-CM | POA: Diagnosis not present

## 2023-05-29 DIAGNOSIS — Z8719 Personal history of other diseases of the digestive system: Secondary | ICD-10-CM | POA: Diagnosis not present

## 2023-05-31 DIAGNOSIS — D649 Anemia, unspecified: Secondary | ICD-10-CM | POA: Diagnosis not present

## 2023-05-31 DIAGNOSIS — Z0000 Encounter for general adult medical examination without abnormal findings: Secondary | ICD-10-CM | POA: Diagnosis not present

## 2023-05-31 DIAGNOSIS — R55 Syncope and collapse: Secondary | ICD-10-CM | POA: Diagnosis not present

## 2023-05-31 DIAGNOSIS — G500 Trigeminal neuralgia: Secondary | ICD-10-CM | POA: Diagnosis not present

## 2023-05-31 DIAGNOSIS — I10 Essential (primary) hypertension: Secondary | ICD-10-CM | POA: Diagnosis not present

## 2023-05-31 DIAGNOSIS — F419 Anxiety disorder, unspecified: Secondary | ICD-10-CM | POA: Diagnosis not present

## 2023-05-31 DIAGNOSIS — R82998 Other abnormal findings in urine: Secondary | ICD-10-CM | POA: Diagnosis not present

## 2023-05-31 DIAGNOSIS — I2510 Atherosclerotic heart disease of native coronary artery without angina pectoris: Secondary | ICD-10-CM | POA: Diagnosis not present

## 2023-05-31 DIAGNOSIS — E871 Hypo-osmolality and hyponatremia: Secondary | ICD-10-CM | POA: Diagnosis not present

## 2023-07-22 NOTE — Progress Notes (Signed)
 Cardiology Office Note:   Date:  07/23/2023  ID:  Kimberly Baxter, DOB 1950/06/22, MRN [id number] PCP:  Bertha Broad, MD  [facility] Providers Cardiologist:  Alyssa Backbone, MD Referring MD: Bertha Broad, MD  Chief Complaint/Reason for Referral: Presyncope ASSESSMENT:    1. Dizziness   2. Coronary artery calcification seen on CAT scan   3. Hyperlipidemia LDL goal <70   4. Aortic atherosclerosis (HCC)   5. Primary hypertension   6. BMI 22.0-22.9, adult     PLAN:   In order of problems listed above: Dizziness: Check reflex TSH, monitor, and echocardiogram.  Unrevealing then this likely represents a vasovagal mechanism related to GI symptoms. Coronary artery calcification: Start aspirin  81 mg, continue rosuvastatin  10 mg.  Strict blood pressure control. Hyperlipidemia: Increase rosuvastatin  to 20 mg check lipid panel, LFTs, and LP(a) in 2 months.  Goal LDL given coronary artery calcification and aortic atherosclerosis is less than 70.  If LP(a) is elevated then goal LDL is less than 55. Aortic atherosclerosis: Start aspirin  81 mg, continue rosuvastatin  10 mg, and strict blood pressure control. Hypertension: Continue amlodipine/olmesartan 5 x 20 mg daily.  BP well-controlled today. Elevated BMI: Continue diet and exercise modification.            Dispo:  Return if symptoms worsen or fail to improve.      Medication Adjustments/Labs and Tests Ordered: Current medicines are reviewed at length with the patient today.  Concerns regarding medicines are outlined above.  The following changes have been made:     Labs/tests ordered: Orders Placed This Encounter  Procedures   Lipid panel   Lipoprotein A (LPA)   Hepatic function panel   LONG TERM MONITOR (3-14 DAYS)   ECHOCARDIOGRAM COMPLETE    Medication Changes: Meds ordered this encounter  Medications   rosuvastatin  (CRESTOR ) 20 MG tablet    Sig: Take 1 tablet (20 mg total) by mouth daily.    Dispense:  90  tablet    Refill:  3   aspirin  EC 81 MG tablet    Sig: Take 1 tablet (81 mg total) by mouth daily. Swallow whole.    Current medicines are reviewed at length with the patient today.  The patient does not have concerns regarding medicines.     History of Present Illness:      FOCUSED PROBLEM LIST:   Coronary artery calcification Calcium  score CT 2024 Hyperlipidemia Aortic atherosclerosis Calcium  score CT 2024 Hypertension Trigeminal neuralgia BMI 24 July 2023:  Patient consents to use of AI scribe. The patient is a 73 year old female with above listed medical problems referred for recommendations regarding presyncope.  The patient was seen by her primary care provider recently and reported dizzy spells particularly around meals.  Because of these issues she is referred to cardiology for further recommendations.  Of note the patient did have a calcium  score CT which demonstrated moderate LAD calcification and aortic atherosclerosis.  he experiences dizzy spells particularly after meals, occurring as infrequently as every eight months to two years. During these episodes, she has abdominal pain, becomes lightheaded, and feels very hot and sweaty. These symptoms typically resolve after a bowel movement. No chest pain is experienced during these episodes, and she has never passed out.  She underwent hernia surgery in January, which has impacted her ability to exercise regularly. Prior to the surgery, she attempted to exercise a couple of days a week. Recently, she has been trying to walk with her husband for the  past five days.  No history of diabetes, smoking, palpitations, leg swelling, or breathing difficulties when lying flat.         Current Medications: Current Meds  Medication Sig   amLODipine-olmesartan (AZOR) 5-20 MG tablet Take 1 tablet by mouth daily.   aspirin  EC 81 MG tablet Take 1 tablet (81 mg total) by mouth daily. Swallow whole.   carbamazepine (TEGRETOL) 200 MG  tablet Take 300 mg by mouth 2 (two) times daily.   escitalopram (LEXAPRO) 10 MG tablet Take 10 mg by mouth daily.   fluticasone (CUTIVATE) 0.05 % cream Apply topically.   Probiotic Product (PROBIOTIC-10 ULTIMATE PO) Take 1 tablet by mouth daily.   rosuvastatin  (CRESTOR ) 20 MG tablet Take 1 tablet (20 mg total) by mouth daily.   traMADol  (ULTRAM ) 50 MG tablet Take 1 tablet (50 mg total) by mouth every 6 (six) hours as needed for severe pain (pain score 7-10).   [DISCONTINUED] rosuvastatin  (CRESTOR ) 10 MG tablet Take 10 mg by mouth daily.     Review of Systems:   Please see the history of present illness.    All other systems reviewed and are negative.     EKGs/Labs/Other Test Reviewed:   EKG: January 2025 sinus rhythm poor R wave progression  EKG Interpretation Date/Time:    Ventricular Rate:    PR Interval:    QRS Duration:    QT Interval:    QTC Calculation:   R Axis:      Text Interpretation:           Risk Assessment/Calculations:          Physical Exam:   VS:  BP 114/74   Pulse 84   Ht 5\' 6"  (1.676 m)   Wt 131 lb (59.4 kg)   LMP 04/03/2002 (Approximate)   SpO2 98%   BMI 21.14 kg/m        Wt Readings from Last 3 Encounters:  07/23/23 131 lb (59.4 kg)  05/04/23 134 lb (60.8 kg)  04/26/23 134 lb 6.4 oz (61 kg)      GENERAL:  No apparent distress, AOx3 HEENT:  No carotid bruits, +2 carotid impulses, no scleral icterus CAR: RRR no murmurs, gallops, rubs, or thrills RES:  Clear to auscultation bilaterally ABD:  Soft, nontender, nondistended, positive bowel sounds x 4 VASC:  +2 radial pulses, +2 carotid pulses NEURO:  CN 2-12 grossly intact; motor and sensory grossly intact PSYCH:  No active depression or anxiety EXT:  No edema, ecchymosis, or cyanosis  Signed, Rebeka Kimble K Kerstyn Coryell, MD  07/23/2023 4:44 PM    [facility] [address] [place], [place], [place]  [address] Phone: [phone]; Fax: [phone]   Note:  This document was  prepared using Dragon voice recognition software and may include unintentional dictation errors.

## 2023-07-23 ENCOUNTER — Ambulatory Visit: Attending: Internal Medicine

## 2023-07-23 ENCOUNTER — Encounter: Payer: Self-pay | Admitting: Internal Medicine

## 2023-07-23 ENCOUNTER — Ambulatory Visit: Attending: Internal Medicine | Admitting: Internal Medicine

## 2023-07-23 ENCOUNTER — Other Ambulatory Visit (HOSPITAL_COMMUNITY): Payer: Self-pay

## 2023-07-23 VITALS — BP 114/74 | HR 84 | Ht 66.0 in | Wt 131.0 lb

## 2023-07-23 DIAGNOSIS — E785 Hyperlipidemia, unspecified: Secondary | ICD-10-CM | POA: Diagnosis not present

## 2023-07-23 DIAGNOSIS — I700 Atherosclerosis of aorta: Secondary | ICD-10-CM

## 2023-07-23 DIAGNOSIS — I2510 Atherosclerotic heart disease of native coronary artery without angina pectoris: Secondary | ICD-10-CM | POA: Diagnosis not present

## 2023-07-23 DIAGNOSIS — Z6822 Body mass index (BMI) 22.0-22.9, adult: Secondary | ICD-10-CM | POA: Diagnosis not present

## 2023-07-23 DIAGNOSIS — R42 Dizziness and giddiness: Secondary | ICD-10-CM

## 2023-07-23 DIAGNOSIS — I10 Essential (primary) hypertension: Secondary | ICD-10-CM

## 2023-07-23 MED ORDER — ASPIRIN 81 MG PO TBEC
81.0000 mg | DELAYED_RELEASE_TABLET | Freq: Every day | ORAL | Status: AC
Start: 2023-07-23 — End: ?

## 2023-07-23 MED ORDER — ROSUVASTATIN CALCIUM 20 MG PO TABS
20.0000 mg | ORAL_TABLET | Freq: Every day | ORAL | 3 refills | Status: DC
  Filled 2023-07-23: qty 90, 90d supply, fill #0

## 2023-07-23 NOTE — Patient Instructions (Signed)
 Medication Instructions:  Increase Crestor  to 20 mg once daily Start Aspirin  81 mg once daily *If you need a refill on your cardiac medications before your next appointment, please call your pharmacy*  Lab Work: Lipid panel, LFT, Lpa today If you have labs (blood work) drawn today and your tests are completely normal, you will receive your results only by: MyChart Message (if you have MyChart) OR A paper copy in the mail If you have any lab test that is abnormal or we need to change your treatment, we will call you to review the results.  Testing/Procedures: Echo Your physician has requested that you have an echocardiogram. Echocardiography is a painless test that uses sound waves to create images of your heart. It provides your doctor with information about the size and shape of your heart and how well your heart's chambers and valves are working. This procedure takes approximately one hour. There are no restrictions for this procedure. Please do NOT wear cologne, perfume, aftershave, or lotions (deodorant is allowed). Please arrive 15 minutes prior to your appointment time.  Please note: We ask at that you not bring children with you during ultrasound (echo/ vascular) testing. Due to room size and safety concerns, children are not allowed in the ultrasound rooms during exams. Our front office staff cannot provide observation of children in our lobby area while testing is being conducted. An adult accompanying a patient to their appointment will only be allowed in the ultrasound room at the discretion of the ultrasound technician under special circumstances. We apologize for any inconvenience.  3 Day Zio Heart Monitor Your physician has requested that you wear a Zio heart monitor for 3 days. This will be mailed to your home with instructions on how to apply the monitor and how to return it when finished. Please allow 2 weeks after returning the heart monitor before our office calls you with the  results.   Follow-Up: At [facility], you and your health needs are our priority.  As part of our continuing mission to provide you with exceptional heart care, our providers are all part of one team.  This team includes your primary Cardiologist (physician) and Advanced Practice Providers or APPs (Physician Assistants and Nurse Practitioners) who all work together to provide you with the care you need, when you need it.  Your next appointment:   As needed  Provider:   Lorie Rook, MD   We recommend signing up for the patient portal called "MyChart".  Sign up information is provided on this After Visit Summary.  MyChart is used to connect with patients for Virtual Visits (Telemedicine).  Patients are able to view lab/test results, encounter notes, upcoming appointments, etc.  Non-urgent messages can be sent to your provider as well.   To learn more about what you can do with MyChart, go to [email or web address].   Other Instructions ZIO XT- Long Term Monitor Instructions  Your physician has requested you wear a ZIO patch monitor for 14 days.  This is a single patch monitor. [employer] supplies one patch monitor per enrollment. Additional stickers are not available. Please do not apply patch if you will be having a Nuclear Stress Test,  Echocardiogram, Cardiac CT, MRI, or Chest Xray during the period you would be wearing the  monitor. The patch cannot be worn during these tests. You cannot remove and re-apply the  ZIO XT patch monitor.  Your ZIO patch monitor will be mailed 3 day USPS to your address on file. It may  take 3-5 days  to receive your monitor after you have been enrolled.  Once you have received your monitor, please review the enclosed instructions. Your monitor  has already been registered assigning a specific monitor serial # to you.  Billing and Patient Assistance Program Information  We have supplied [employer] with any of your insurance information on file for billing  purposes. [employer] offers a sliding scale Patient Assistance Program for patients that do not have  insurance, or whose insurance does not completely cover the cost of the ZIO monitor.  You must apply for the Patient Assistance Program to qualify for this discounted rate.  To apply, please call [employer] at [phone], select option 4, select option 2, ask to apply for  Patient Assistance Program. Sanna Crystal will ask your household income, and how many people  are in your household. They will quote your out-of-pocket cost based on that information.  [employer] will also be able to set up a 81-month, interest-free payment plan if needed.  Applying the monitor   Shave hair from upper left chest.  Hold abrader disc by orange tab. Rub abrader in 40 strokes over the upper left chest as  indicated in your monitor instructions.  Clean area with 4 enclosed alcohol  pads. Let dry.  Apply patch as indicated in monitor instructions. Patch will be placed under collarbone on left  side of chest with arrow pointing upward.  Rub patch adhesive wings for 2 minutes. Remove white label marked "1". Remove the white  label marked "2". Rub patch adhesive wings for 2 additional minutes.  While looking in a mirror, press and release button in center of patch. A small green light will  flash 3-4 times. This will be your only indicator that the monitor has been turned on.  Do not shower for the first 24 hours. You may shower after the first 24 hours.  Press the button if you feel a symptom. You will hear a small click. Record Date, Time and  Symptom in the Patient Logbook.  When you are ready to remove the patch, follow instructions on the last 2 pages of Patient  Logbook. Stick patch monitor onto the last page of Patient Logbook.  Place Patient Logbook in the blue and white box. Use locking tab on box and tape box closed  securely. The blue and white box has prepaid postage on it. Please place it in the mailbox as  soon  as possible. Your physician should have your test results approximately 7 days after the  monitor has been mailed back to [facility].  Call [facility] Customer Care at [phone] if you have questions regarding  your ZIO XT patch monitor. Call them immediately if you see an orange light blinking on your  monitor.  If your monitor falls off in less than 4 days, contact our Monitor department at [phone].  If your monitor becomes loose or falls off after 4 days call [employer] at [phone] for  suggestions on securing your monitor       1st Floor: - Lobby - Registration  - Pharmacy  - Lab - Cafe  2nd Floor: - PV Lab - Diagnostic Testing (echo, CT, nuclear med)  3rd Floor: - Vacant  4th Floor: - TCTS (cardiothoracic surgery) - AFib Clinic - Structural Heart Clinic - Vascular Surgery  - Vascular Ultrasound  5th Floor: - HeartCare Cardiology (general and EP) - Clinical Pharmacy for coumadin, hypertension, lipid, weight-loss medications, and med management appointments    Valet parking services  will be available as well.

## 2023-07-23 NOTE — Progress Notes (Unsigned)
 Enrolled for [employer] to mail a ZIO XT long term holter monitor to the patients address on file.

## 2023-07-24 ENCOUNTER — Encounter: Payer: Self-pay | Admitting: Internal Medicine

## 2023-07-24 LAB — LIPID PANEL
Chol/HDL Ratio: 2.1 ratio (ref 0.0–4.4)
Cholesterol, Total: 237 mg/dL — ABNORMAL HIGH (ref 100–199)
HDL: 113 mg/dL (ref >39)
LDL Chol Calc (NIH): 107 mg/dL — ABNORMAL HIGH (ref 0–99)
Triglycerides: 102 mg/dL (ref 0–149)
VLDL Cholesterol Cal: 17 mg/dL (ref 5–40)

## 2023-07-24 LAB — LIPOPROTEIN A (LPA): Lipoprotein (a): 58 nmol/L (ref <75.0)

## 2023-07-24 LAB — HEPATIC FUNCTION PANEL
ALT: 19 IU/L (ref 0–32)
AST: 27 IU/L (ref 0–40)
Albumin: 4.7 g/dL (ref 3.8–4.8)
Alkaline Phosphatase: 122 IU/L — ABNORMAL HIGH (ref 44–121)
Bilirubin Total: <0.2 mg/dL (ref 0.0–1.2)
Bilirubin, Direct: 0.08 mg/dL (ref 0.00–0.40)
Total Protein: 6.7 g/dL (ref 6.0–8.5)

## 2023-08-01 ENCOUNTER — Other Ambulatory Visit: Payer: Self-pay | Admitting: *Deleted

## 2023-08-01 DIAGNOSIS — E785 Hyperlipidemia, unspecified: Secondary | ICD-10-CM

## 2023-08-01 NOTE — Progress Notes (Signed)
 Needs lipids, liver in 8 weeks.

## 2023-08-08 ENCOUNTER — Encounter: Payer: Self-pay | Admitting: Internal Medicine

## 2023-08-08 DIAGNOSIS — E785 Hyperlipidemia, unspecified: Secondary | ICD-10-CM

## 2023-08-08 DIAGNOSIS — Z6822 Body mass index (BMI) 22.0-22.9, adult: Secondary | ICD-10-CM

## 2023-08-08 DIAGNOSIS — I700 Atherosclerosis of aorta: Secondary | ICD-10-CM | POA: Diagnosis not present

## 2023-08-08 DIAGNOSIS — R42 Dizziness and giddiness: Secondary | ICD-10-CM | POA: Diagnosis not present

## 2023-08-08 DIAGNOSIS — I10 Essential (primary) hypertension: Secondary | ICD-10-CM | POA: Diagnosis not present

## 2023-08-08 DIAGNOSIS — I2510 Atherosclerotic heart disease of native coronary artery without angina pectoris: Secondary | ICD-10-CM | POA: Diagnosis not present

## 2023-08-21 ENCOUNTER — Ambulatory Visit (HOSPITAL_COMMUNITY)
Admission: RE | Admit: 2023-08-21 | Discharge: 2023-08-21 | Disposition: A | Discharge status: Home / Self Care | Source: Ambulatory Visit | Attending: Internal Medicine | Admitting: Internal Medicine

## 2023-08-21 ENCOUNTER — Ambulatory Visit: Payer: Self-pay | Admitting: Internal Medicine

## 2023-08-21 DIAGNOSIS — E785 Hyperlipidemia, unspecified: Secondary | ICD-10-CM | POA: Diagnosis not present

## 2023-08-21 DIAGNOSIS — I10 Essential (primary) hypertension: Secondary | ICD-10-CM | POA: Insufficient documentation

## 2023-08-21 DIAGNOSIS — I700 Atherosclerosis of aorta: Secondary | ICD-10-CM | POA: Insufficient documentation

## 2023-08-21 DIAGNOSIS — R42 Dizziness and giddiness: Secondary | ICD-10-CM | POA: Insufficient documentation

## 2023-08-21 DIAGNOSIS — I2510 Atherosclerotic heart disease of native coronary artery without angina pectoris: Secondary | ICD-10-CM | POA: Diagnosis not present

## 2023-08-21 DIAGNOSIS — Z6822 Body mass index (BMI) 22.0-22.9, adult: Secondary | ICD-10-CM | POA: Insufficient documentation

## 2023-08-21 LAB — ECHOCARDIOGRAM COMPLETE
Area-P 1/2: 2.47 cm2
S' Lateral: 2.6 cm

## 2023-09-05 DIAGNOSIS — H5203 Hypermetropia, bilateral: Secondary | ICD-10-CM | POA: Diagnosis not present

## 2023-09-05 DIAGNOSIS — H2513 Age-related nuclear cataract, bilateral: Secondary | ICD-10-CM | POA: Diagnosis not present

## 2023-10-15 ENCOUNTER — Encounter: Payer: Self-pay | Admitting: Internal Medicine

## 2023-10-17 DIAGNOSIS — E785 Hyperlipidemia, unspecified: Secondary | ICD-10-CM | POA: Diagnosis not present

## 2023-10-18 ENCOUNTER — Ambulatory Visit: Payer: Self-pay | Admitting: Internal Medicine

## 2023-10-18 DIAGNOSIS — E785 Hyperlipidemia, unspecified: Secondary | ICD-10-CM

## 2023-10-18 LAB — HEPATIC FUNCTION PANEL
ALT: 26 IU/L (ref 0–32)
AST: 33 IU/L (ref 0–40)
Albumin: 4.6 g/dL (ref 3.8–4.8)
Alkaline Phosphatase: 121 IU/L (ref 44–121)
Bilirubin Total: 0.2 mg/dL (ref 0.0–1.2)
Bilirubin, Direct: 0.11 mg/dL (ref 0.00–0.40)
Total Protein: 6.9 g/dL (ref 6.0–8.5)

## 2023-10-18 LAB — LIPID PANEL
Chol/HDL Ratio: 2 ratio (ref 0.0–4.4)
Cholesterol, Total: 231 mg/dL — ABNORMAL HIGH (ref 100–199)
HDL: 117 mg/dL (ref >39)
LDL Chol Calc (NIH): 107 mg/dL — ABNORMAL HIGH (ref 0–99)
Triglycerides: 42 mg/dL (ref 0–149)
VLDL Cholesterol Cal: 7 mg/dL (ref 5–40)

## 2023-10-22 ENCOUNTER — Other Ambulatory Visit (HOSPITAL_COMMUNITY): Payer: Self-pay

## 2023-10-22 MED ORDER — ROSUVASTATIN CALCIUM 40 MG PO TABS
40.0000 mg | ORAL_TABLET | Freq: Every day | ORAL | 3 refills | Status: AC
  Filled 2023-10-22: qty 90, 90d supply, fill #0
  Filled 2024-01-08 – 2024-01-11 (×2): qty 90, 90d supply, fill #1
  Filled 2024-04-17: qty 90, 90d supply, fill #2

## 2024-01-10 ENCOUNTER — Other Ambulatory Visit: Payer: Self-pay

## 2024-01-10 ENCOUNTER — Other Ambulatory Visit (HOSPITAL_COMMUNITY): Payer: Self-pay

## 2024-01-23 ENCOUNTER — Other Ambulatory Visit: Payer: Self-pay | Admitting: Internal Medicine

## 2024-01-23 DIAGNOSIS — Z1231 Encounter for screening mammogram for malignant neoplasm of breast: Secondary | ICD-10-CM

## 2024-01-23 DIAGNOSIS — M79671 Pain in right foot: Secondary | ICD-10-CM | POA: Diagnosis not present

## 2024-01-23 DIAGNOSIS — S93491A Sprain of other ligament of right ankle, initial encounter: Secondary | ICD-10-CM | POA: Diagnosis not present

## 2024-01-29 DIAGNOSIS — H2512 Age-related nuclear cataract, left eye: Secondary | ICD-10-CM | POA: Diagnosis not present

## 2024-01-29 DIAGNOSIS — Z961 Presence of intraocular lens: Secondary | ICD-10-CM | POA: Diagnosis not present

## 2024-01-29 DIAGNOSIS — H25811 Combined forms of age-related cataract, right eye: Secondary | ICD-10-CM | POA: Diagnosis not present

## 2024-02-05 DIAGNOSIS — D485 Neoplasm of uncertain behavior of skin: Secondary | ICD-10-CM | POA: Diagnosis not present

## 2024-02-05 DIAGNOSIS — L570 Actinic keratosis: Secondary | ICD-10-CM | POA: Diagnosis not present

## 2024-02-05 DIAGNOSIS — Z85828 Personal history of other malignant neoplasm of skin: Secondary | ICD-10-CM | POA: Diagnosis not present

## 2024-02-05 DIAGNOSIS — L821 Other seborrheic keratosis: Secondary | ICD-10-CM | POA: Diagnosis not present

## 2024-02-05 DIAGNOSIS — L218 Other seborrheic dermatitis: Secondary | ICD-10-CM | POA: Diagnosis not present

## 2024-02-08 ENCOUNTER — Other Ambulatory Visit: Payer: Self-pay | Admitting: Medical Genetics

## 2024-02-12 DIAGNOSIS — H52201 Unspecified astigmatism, right eye: Secondary | ICD-10-CM | POA: Diagnosis not present

## 2024-02-12 DIAGNOSIS — H2511 Age-related nuclear cataract, right eye: Secondary | ICD-10-CM | POA: Diagnosis not present

## 2024-02-12 DIAGNOSIS — Z961 Presence of intraocular lens: Secondary | ICD-10-CM | POA: Diagnosis not present

## 2024-02-12 DIAGNOSIS — H25811 Combined forms of age-related cataract, right eye: Secondary | ICD-10-CM | POA: Diagnosis not present

## 2024-02-14 ENCOUNTER — Ambulatory Visit
Admission: RE | Admit: 2024-02-14 | Discharge: 2024-02-14 | Disposition: A | Discharge status: Home / Self Care | Source: Ambulatory Visit | Attending: Internal Medicine | Admitting: Internal Medicine

## 2024-02-14 DIAGNOSIS — Z1231 Encounter for screening mammogram for malignant neoplasm of breast: Secondary | ICD-10-CM

## 2024-04-17 ENCOUNTER — Encounter: Payer: Self-pay | Admitting: Internal Medicine

## 2024-04-17 DIAGNOSIS — E785 Hyperlipidemia, unspecified: Secondary | ICD-10-CM

## 2024-04-24 ENCOUNTER — Ambulatory Visit: Payer: Self-pay | Admitting: Internal Medicine

## 2024-04-24 ENCOUNTER — Other Ambulatory Visit (HOSPITAL_COMMUNITY): Payer: Self-pay

## 2024-04-24 DIAGNOSIS — E785 Hyperlipidemia, unspecified: Secondary | ICD-10-CM

## 2024-04-24 LAB — LIPID PANEL
Chol/HDL Ratio: 1.9 ratio (ref 0.0–4.4)
Cholesterol, Total: 212 mg/dL — ABNORMAL HIGH (ref 100–199)
HDL: 113 mg/dL
LDL Chol Calc (NIH): 80 mg/dL (ref 0–99)
Triglycerides: 111 mg/dL (ref 0–149)
VLDL Cholesterol Cal: 19 mg/dL (ref 5–40)

## 2024-04-24 LAB — HEPATIC FUNCTION PANEL
ALT: 20 IU/L (ref 0–32)
AST: 27 IU/L (ref 0–40)
Albumin: 5.2 g/dL — ABNORMAL HIGH (ref 3.8–4.8)
Alkaline Phosphatase: 107 IU/L (ref 49–135)
Bilirubin Total: 0.3 mg/dL (ref 0.0–1.2)
Bilirubin, Direct: 0.11 mg/dL (ref 0.00–0.40)
Total Protein: 7.2 g/dL (ref 6.0–8.5)

## 2024-04-24 MED ORDER — EZETIMIBE 10 MG PO TABS
10.0000 mg | ORAL_TABLET | Freq: Every day | ORAL | 3 refills | Status: AC
  Filled 2024-04-24: qty 90, 90d supply, fill #0

## 2024-04-28 ENCOUNTER — Other Ambulatory Visit (HOSPITAL_COMMUNITY)

## 2024-06-03 ENCOUNTER — Other Ambulatory Visit (HOSPITAL_COMMUNITY)
